# Patient Record
Sex: Female | Born: 1965 | Race: Black or African American | Hispanic: No | Smoking: Never smoker
Health system: Southern US, Community
[De-identification: ages and names within clinical notes are randomized; demographics above are authoritative.]

## PROBLEM LIST (undated history)

## (undated) DIAGNOSIS — Z8719 Personal history of other diseases of the digestive system: Secondary | ICD-10-CM

## (undated) DIAGNOSIS — M1990 Unspecified osteoarthritis, unspecified site: Secondary | ICD-10-CM

## (undated) DIAGNOSIS — I10 Essential (primary) hypertension: Secondary | ICD-10-CM

## (undated) DIAGNOSIS — T4145XA Adverse effect of unspecified anesthetic, initial encounter: Secondary | ICD-10-CM

## (undated) DIAGNOSIS — T8859XA Other complications of anesthesia, initial encounter: Secondary | ICD-10-CM

## (undated) DIAGNOSIS — E785 Hyperlipidemia, unspecified: Secondary | ICD-10-CM

## (undated) HISTORY — DX: Hyperlipidemia, unspecified: E78.5

## (undated) HISTORY — PX: APPENDECTOMY: SHX54

## (undated) HISTORY — PX: TUBAL LIGATION: SHX77

## (undated) HISTORY — DX: Unspecified osteoarthritis, unspecified site: M19.90

## (undated) HISTORY — DX: Essential (primary) hypertension: I10

---

## 1996-10-09 HISTORY — PX: DILATION AND CURETTAGE OF UTERUS: SHX78

## 1996-10-09 HISTORY — PX: HYSTEROSCOPY: SHX211

## 2004-02-22 ENCOUNTER — Encounter: Admission: RE | Admit: 2004-02-22 | Discharge: 2004-02-22 | Payer: Self-pay | Admitting: Gastroenterology

## 2004-10-09 HISTORY — PX: ENDOMETRIAL ABLATION: SHX621

## 2011-01-26 ENCOUNTER — Emergency Department (HOSPITAL_COMMUNITY)
Admission: EM | Admit: 2011-01-26 | Discharge: 2011-01-27 | Disposition: A | Discharge status: Home / Self Care | Payer: 59 | Attending: Emergency Medicine | Admitting: Emergency Medicine

## 2011-01-26 ENCOUNTER — Emergency Department (HOSPITAL_COMMUNITY): Payer: 59

## 2011-01-26 DIAGNOSIS — I10 Essential (primary) hypertension: Secondary | ICD-10-CM | POA: Insufficient documentation

## 2011-01-26 DIAGNOSIS — R0609 Other forms of dyspnea: Secondary | ICD-10-CM | POA: Insufficient documentation

## 2011-01-26 DIAGNOSIS — R0602 Shortness of breath: Secondary | ICD-10-CM | POA: Insufficient documentation

## 2011-01-26 DIAGNOSIS — R059 Cough, unspecified: Secondary | ICD-10-CM | POA: Insufficient documentation

## 2011-01-26 DIAGNOSIS — R079 Chest pain, unspecified: Secondary | ICD-10-CM | POA: Insufficient documentation

## 2011-01-26 DIAGNOSIS — R05 Cough: Secondary | ICD-10-CM | POA: Insufficient documentation

## 2011-01-26 DIAGNOSIS — E789 Disorder of lipoprotein metabolism, unspecified: Secondary | ICD-10-CM | POA: Insufficient documentation

## 2011-01-26 DIAGNOSIS — Z79899 Other long term (current) drug therapy: Secondary | ICD-10-CM | POA: Insufficient documentation

## 2011-01-26 DIAGNOSIS — M069 Rheumatoid arthritis, unspecified: Secondary | ICD-10-CM | POA: Insufficient documentation

## 2011-01-26 DIAGNOSIS — R0989 Other specified symptoms and signs involving the circulatory and respiratory systems: Secondary | ICD-10-CM | POA: Insufficient documentation

## 2011-01-26 LAB — BASIC METABOLIC PANEL
GFR calc non Af Amer: >60 mL/min (ref >60)
Potassium: 4.2 mEq/L (ref 3.5–5.1)
Sodium: 141 mEq/L (ref 135–145)

## 2011-01-26 LAB — DIFFERENTIAL
Basophils Absolute: 0 10*3/uL (ref 0.0–0.1)
Eosinophils Relative: 2 % (ref 0–5)
Lymphocytes Relative: 45 % (ref 12–46)
Lymphs Abs: 3.2 10*3/uL (ref 0.7–4.0)
Monocytes Absolute: 0.4 10*3/uL (ref 0.1–1.0)
Monocytes Relative: 5 % (ref 3–12)
Neutro Abs: 3.3 10*3/uL (ref 1.7–7.7)
Neutrophils Relative %: 47 % (ref 43–77)

## 2011-01-26 LAB — POCT CARDIAC MARKERS
CKMB, poc: 1 ng/mL (ref 1.0–8.0)
Troponin i, poc: <0.05 ng/mL (ref 0.00–0.09)

## 2011-01-26 LAB — CBC
Hemoglobin: 12.6 g/dL (ref 12.0–15.0)
MCHC: 33 g/dL (ref 30.0–36.0)
Platelets: 285 10*3/uL (ref 150–400)

## 2011-01-26 LAB — D-DIMER, QUANTITATIVE: D-Dimer, Quant: 0.58 ug/mL-FEU — ABNORMAL HIGH (ref 0.00–0.48)

## 2011-01-26 LAB — BRAIN NATRIURETIC PEPTIDE: Pro B Natriuretic peptide (BNP): <30 pg/mL (ref 0.0–100.0)

## 2011-01-27 MED ORDER — IOHEXOL 300 MG/ML  SOLN
100.0000 mL | Freq: Once | INTRAMUSCULAR | Status: AC | PRN
  Administered 2011-01-27: 100 mL via INTRAVENOUS

## 2011-08-30 ENCOUNTER — Telehealth (INDEPENDENT_AMBULATORY_CARE_PROVIDER_SITE_OTHER): Payer: Self-pay

## 2011-08-30 NOTE — Telephone Encounter (Signed)
08/29/11 Jacqueline Morrison @ [facility] left a voicemail referring patient to the bariatric surgery program. She asked that an appointment be scheduled for the patient. I scheduled the patient for the bariatric surgery seminar at [facility] for 09/05/11. Mailed information to patient and faxed information to [facility] @  Walk-in Medical Care/Family Medicine/Occupational Health: Barrington Ellison. [address], [place] [address] Phone: [phone]   Fax: [phone]

## 2011-09-29 ENCOUNTER — Ambulatory Visit (INDEPENDENT_AMBULATORY_CARE_PROVIDER_SITE_OTHER): Payer: 59 | Admitting: Surgery

## 2011-09-29 ENCOUNTER — Encounter (INDEPENDENT_AMBULATORY_CARE_PROVIDER_SITE_OTHER): Payer: Self-pay | Admitting: Surgery

## 2011-09-29 ENCOUNTER — Other Ambulatory Visit (INDEPENDENT_AMBULATORY_CARE_PROVIDER_SITE_OTHER): Payer: Self-pay | Admitting: General Surgery

## 2011-09-29 VITALS — BP 136/84 | HR 68 | Temp 97.4°F | Resp 20 | Ht 69.0 in | Wt 399.0 lb

## 2011-09-29 DIAGNOSIS — E669 Obesity, unspecified: Secondary | ICD-10-CM

## 2011-09-29 NOTE — Patient Instructions (Signed)
Scheduling studies per our office

## 2011-09-29 NOTE — Progress Notes (Signed)
Addended by: Latricia Heft on: 09/29/2011 11:21 AM   Modules accepted: Orders

## 2011-09-29 NOTE — Progress Notes (Signed)
Chief Complaint:  Morbid obesity BMI 60  History of Present Illness:  Jacqueline Morrison is an 45 y.o. female with lifelong obesity who comes in today to discuss lap band. She is been into 2 of our informational sessions and is aware of bands, bypasses on sleeves. She knows several of our patients and that has helped her make a decision on what she wanted to have and when. She really has no questions about the procedure and we did give her the [employer]er[employer] booklet on lapband.  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  She is followed by Dr. Benson [place]. She denies having any GERD or obstructive sleep apnea.    Past Medical History  Diagnosis Date  . Arthritis   . Diabetes mellitus     gestational  . Hyperlipidemia   . Hypertension     Past Surgical History  Procedure Date  . Appendectomy   . Tubal ligation   . Hysteroscopy 1998  . Dilation and curettage of uterus 1998  . Endometrial ablation 2006    Medications Prior to Admission  Medication Sig Dispense Refill  . CRESTOR 40 MG tablet daily.      . ramipril (ALTACE) 10 MG capsule daily.       No current facility-administered medications on file as of 09/29/2011.   No Known Allergies Family History  Problem Relation Age of Onset  . Cancer Maternal Aunt     breast  . Cancer Maternal Grandfather     colon   Social History:   reports that she has never smoked. She does not have any smokeless tobacco history on file. She reports that she drinks alcohol. She reports that she does not use illicit drugs.   REVIEW OF SYSTEMS - PERTINENT POSITIVES ONLY: 15 point review of systems is positive for occasional palpitations, arthritis pains, headache. She also has had a history of gestational diabetes, hypercholesterolemia, and hypertension. She does not smoke does not use drugs  Physical Exam:   Blood pressure 136/84, pulse 68, temperature 97.4 F (36.3 C), temperature source Temporal, resp. rate 20, height 5'  9" (1.753 m), weight 399 lb (180.985 kg). Body mass index is 58.92 kg/(m^2).  Gen:  No acute distress.  Well nourished and well groomed.   Neurological: Alert and oriented to person, place, and time. Coordination normal.  Head: Normocephalic and atraumatic.  Eyes: Conjunctivae are normal. Pupils are equal, round, and reactive to light. No scleral icterus.  Neck: Normal range of motion. Neck supple. No tracheal deviation or thyromegaly present.  Cardiovascular:  SR without murmurs or gallops Respiratory: Effort normal.  No respiratory distress. No chest wall tenderness. Breath sounds normal.  No wheezes, rales or rhonchi.  GI:  The abdomen is soft and nontender.  There is no rebound and no guarding. GU: Musculoskeletal: Normal range of motion. Extremities are nontender.  Lymphadenopathy: No cervical, preauricular, postauricular or axillary adenopathy is present Skin: Skin is warm and dry. No rash noted. No diaphoresis. No erythema. No pallor. No clubbing, cyanosis, or edema.  Pscyh: Normal mood and affect. Behavior is normal. Judgment and thought content normal.   LABORATORY RESULTS: No results found for this or any previous visit (from the past 48 hour(s)).  RADIOLOGY RESULTS: No results found.  Problem List: Active Problems:  * No active hospital problems. *    Assessment & Plan: BMI 60 and appropriate candidate for laparoscopic adjustable gastric banding. Will proceed with workup.    Matt  Hortencia Conradi, MD, [facility] [phone] beeper [phone]  09/29/2011 11:05 AM

## 2011-10-16 ENCOUNTER — Other Ambulatory Visit (INDEPENDENT_AMBULATORY_CARE_PROVIDER_SITE_OTHER): Payer: Self-pay | Admitting: Surgery

## 2011-10-16 ENCOUNTER — Ambulatory Visit (HOSPITAL_COMMUNITY)
Admission: RE | Admit: 2011-10-16 | Discharge: 2011-10-16 | Disposition: A | Discharge status: Home / Self Care | Payer: 59 | Source: Ambulatory Visit | Attending: Surgery | Admitting: Surgery

## 2011-10-16 ENCOUNTER — Other Ambulatory Visit: Payer: Self-pay

## 2011-10-16 DIAGNOSIS — I10 Essential (primary) hypertension: Secondary | ICD-10-CM | POA: Insufficient documentation

## 2011-10-16 DIAGNOSIS — M129 Arthropathy, unspecified: Secondary | ICD-10-CM | POA: Insufficient documentation

## 2011-10-16 DIAGNOSIS — Z6841 Body Mass Index (BMI) 40.0 and over, adult: Secondary | ICD-10-CM | POA: Insufficient documentation

## 2011-10-16 DIAGNOSIS — E785 Hyperlipidemia, unspecified: Secondary | ICD-10-CM | POA: Insufficient documentation

## 2011-10-17 LAB — CBC
Hemoglobin: 12.9 g/dL (ref 12.0–15.0)
MCHC: 32.2 g/dL (ref 30.0–36.0)
MCV: 94.1 fL (ref 78.0–100.0)
Platelets: 289 10*3/uL (ref 150–400)
RBC: 4.26 MIL/uL (ref 3.87–5.11)

## 2011-10-17 LAB — COMPREHENSIVE METABOLIC PANEL
AST: 12 U/L (ref 0–37)
Albumin: 4 g/dL (ref 3.5–5.2)
BUN: 14 mg/dL (ref 6–23)
Creat: 0.87 mg/dL (ref 0.50–1.10)
Glucose, Bld: 96 mg/dL (ref 70–99)
Total Bilirubin: 0.4 mg/dL (ref 0.3–1.2)
Total Protein: 6.7 g/dL (ref 6.0–8.3)

## 2011-10-25 ENCOUNTER — Ambulatory Visit: Payer: 59 | Admitting: *Deleted

## 2011-10-27 ENCOUNTER — Other Ambulatory Visit (HOSPITAL_COMMUNITY): Payer: 59

## 2011-10-30 ENCOUNTER — Ambulatory Visit (HOSPITAL_COMMUNITY)
Admission: RE | Admit: 2011-10-30 | Discharge: 2011-10-30 | Disposition: A | Discharge status: Home / Self Care | Payer: 59 | Source: Ambulatory Visit | Attending: Surgery | Admitting: Surgery

## 2011-10-30 ENCOUNTER — Other Ambulatory Visit (INDEPENDENT_AMBULATORY_CARE_PROVIDER_SITE_OTHER): Payer: Self-pay | Admitting: Surgery

## 2011-10-30 DIAGNOSIS — Z01818 Encounter for other preprocedural examination: Secondary | ICD-10-CM | POA: Insufficient documentation

## 2011-10-30 DIAGNOSIS — K449 Diaphragmatic hernia without obstruction or gangrene: Secondary | ICD-10-CM | POA: Insufficient documentation

## 2011-11-01 ENCOUNTER — Encounter: Payer: 59 | Attending: Surgery | Admitting: *Deleted

## 2011-11-01 ENCOUNTER — Encounter: Payer: Self-pay | Admitting: *Deleted

## 2011-11-01 DIAGNOSIS — Z01818 Encounter for other preprocedural examination: Secondary | ICD-10-CM | POA: Insufficient documentation

## 2011-11-01 DIAGNOSIS — E669 Obesity, unspecified: Secondary | ICD-10-CM

## 2011-11-01 DIAGNOSIS — Z713 Dietary counseling and surveillance: Secondary | ICD-10-CM | POA: Insufficient documentation

## 2011-11-01 NOTE — Progress Notes (Signed)
  Pre-Op Assessment Visit: Pre-Operative LAGB Surgery  Medical Nutrition Therapy:  Appt start time: 0730 end time:  0830.  Patient was seen on 11/01/2011 for Pre-Operative LAGB Nutrition Assessment. Assessment and letter of approval faxed to [facility] Bariatric Surgery Program coordinator on 11/01/2011.  Approval letter sent to [facility] center and will be available in the chart under the media tab.  Handouts given during visit include:  Pre-Op Goals Handout  Patient to call for Pre-Op and Post-Op Nutrition Education at [facility] when surgery is scheduled.

## 2011-11-01 NOTE — Patient Instructions (Signed)
   Follow Pre-Op Nutrition Goals to prepare for LAGB Surgery.   Call the [facility] at [phone] once you have been given your surgery date to enrolled in the Pre-Op Nutrition Class. You will need to attend this nutrition class 3-4 weeks prior to your surgery. 

## 2012-01-18 ENCOUNTER — Encounter (INDEPENDENT_AMBULATORY_CARE_PROVIDER_SITE_OTHER): Payer: Self-pay

## 2012-03-21 ENCOUNTER — Encounter: Payer: 59 | Attending: Surgery | Admitting: *Deleted

## 2012-03-21 DIAGNOSIS — Z713 Dietary counseling and surveillance: Secondary | ICD-10-CM | POA: Insufficient documentation

## 2012-03-21 DIAGNOSIS — E669 Obesity, unspecified: Secondary | ICD-10-CM

## 2012-03-21 DIAGNOSIS — Z01818 Encounter for other preprocedural examination: Secondary | ICD-10-CM | POA: Insufficient documentation

## 2012-03-21 NOTE — Progress Notes (Signed)
  Bariatric Class:  Appt start time: 0830 end time:  0930.  Pre-Operative Nutrition Class  Patient was seen on 03/21/2012 for Pre-Operative Bariatric Surgery Education at the [facility].  Surgery date: 04/16/12 Surgery type: LAGB  Samples given per MNT protocol: Bariatric Advantage Multivitamin Lot # [id number] Exp: 09/13  Bariatric Advantage Calcium Citrate Lot # [id number] Exp: 09/13  Celebrate Vitamins Multivitamin Complete - Lot # [id number]; Exp: 11/14 Multivitamin - Lot # [id number]; Exp: 07/14  Celebrate Vitamins Iron 30 mg +C Lot # [id number] Exp:  07/14  Corliss Marcus Protein Powder Lot # [id number] Exp: 09/14  The following the learning objective met by the patient during this course:   Identifies Pre-Op Dietary Goals and will begin 2 weeks pre-operatively   Identifies appropriate sources of fluids and proteins   States protein recommendations and appropriate sources pre and post-operatively  Identifies Post-Operative Dietary Goals and will follow for 2 weeks post-operatively  Identifies appropriate multivitamin and calcium sources  Describes the need for physical activity post-operatively and will follow MD recommendations  States when to call healthcare provider regarding medication questions or post-operative complications  Handouts given during class include:  Pre-Op Bariatric Surgery Diet Handout  Protein Shake Handout  Post-Op Bariatric Surgery Nutrition Handout  BELT Program Information Flyer  Support Group Information Flyer  Follow-Up Plan: Patient will follow-up at [facility] 2 weeks post operatively for diet advancement per MD.

## 2012-03-21 NOTE — Patient Instructions (Signed)
Follow:   Pre-Op Diet per MD 2 weeks prior to surgery  Phase 2- Liquids (clear/full) 2 weeks after surgery  Vitamin/Mineral/Calcium guidelines for purchasing bariatric supplements  Exercise guidelines pre and post-op per MD  Follow-up at [facility] in 2 weeks post-op for diet advancement. Contact Cydney Alvarenga as needed with questions/concerns. 

## 2012-03-29 ENCOUNTER — Encounter (HOSPITAL_COMMUNITY): Payer: Self-pay | Admitting: Pharmacy Technician

## 2012-04-04 ENCOUNTER — Encounter (INDEPENDENT_AMBULATORY_CARE_PROVIDER_SITE_OTHER): Payer: Self-pay | Admitting: Surgery

## 2012-04-04 ENCOUNTER — Ambulatory Visit (INDEPENDENT_AMBULATORY_CARE_PROVIDER_SITE_OTHER): Payer: Self-pay | Admitting: Surgery

## 2012-04-04 VITALS — BP 118/68 | HR 84 | Temp 97.6°F | Resp 18 | Ht 68.0 in | Wt 383.4 lb

## 2012-04-04 DIAGNOSIS — E669 Obesity, unspecified: Secondary | ICD-10-CM

## 2012-04-04 NOTE — Progress Notes (Signed)
Chief Complaint:  Morbid obesity with a BMI of 58 for lap band surgery  History of Present Illness:  Jacqueline Morrison is an 46 y.o. female who comes in today for a preop lab and surgery visit. Her surgery is scheduled for July 9. She is followed by Dr. Carolynn Sayers.  Her preoperative upper GI showed a small sliding hiatal hernia but no GE reflux. She denies any symptoms of GE reflux. Her ultrasound suggested a deep hypoechoic probable hemangioma of the left lobe of the liver. I mentioned that to her and ask her carmine me to order followup studies in about 6 months. She is studied him information on the lapband and is ready for the surgery.  Past Medical History  Diagnosis Date  . Arthritis   . Diabetes mellitus     gestational  . Hyperlipidemia   . Hypertension     Past Surgical History  Procedure Date  . Appendectomy   . Tubal ligation   . Hysteroscopy 1998  . Dilation and curettage of uterus 1998  . Endometrial ablation 2006    Current Outpatient Prescriptions  Medication Sig Dispense Refill  . acetaminophen (TYLENOL) 500 MG tablet Take 500 mg by mouth every 6 (six) hours as needed. pain      . CRESTOR 40 MG tablet Take 40 mg by mouth daily.       . Multiple Vitamin (MULTIVITAMIN) capsule Take 1 capsule by mouth daily.        . ramipril (ALTACE) 10 MG capsule Take 10 mg by mouth daily.        Review of patient's allergies indicates no known allergies. Family History  Problem Relation Age of Onset  . Cancer Maternal Aunt     breast  . Cancer Maternal Grandfather     colon   Social History:   reports that she has never smoked. She does not have any smokeless tobacco history on file. She reports that she drinks about 1.2 ounces of alcohol per week. She reports that she does not use illicit drugs.   REVIEW OF SYSTEMS - PERTINENT POSITIVES ONLY: noncontributory  Physical Exam:   Blood pressure 118/68, pulse 84, temperature 97.6 F (36.4 C), temperature source Temporal, resp.  rate 18, height 5\' 8"  (1.727 m), weight 383 lb 6 oz (173.898 kg). Body mass index is 58.29 kg/(m^2).  Gen:  WDWN AAF NAD  Neurological: Alert and oriented to person, place, and time. Motor and sensory function is grossly intact  Head: Normocephalic and atraumatic.  Eyes: Conjunctivae are normal. Pupils are equal, round, and reactive to light. No scleral icterus.  Neck: Normal range of motion. Neck supple. No tracheal deviation or thyromegaly present.  Cardiovascular:  SR without murmurs or gallops.  No carotid bruits Respiratory: Effort normal.  No respiratory distress. No chest wall tenderness. Breath sounds normal.  No wheezes, rales or rhonchi.  Abdomen:  obese GU: Musculoskeletal: Normal range of motion. Extremities are nontender. No cyanosis, edema or clubbing noted Lymphadenopathy: No cervical, preauricular, postauricular or axillary adenopathy is present Skin: Skin is warm and dry. No rash noted. No diaphoresis. No erythema. No pallor. Pscyh: Normal mood and affect. Behavior is normal. Judgment and thought content normal.   LABORATORY RESULTS: No results found for this or any previous visit (from the past 48 hour(s)).  RADIOLOGY RESULTS: No results found.  Problem List: Patient Active Problem List  Diagnosis  . Obesity    Assessment & Plan: Morbid obesity for lap band. Small hiatal hernia  noted on upper GI. Will check with balloon test and patient informed we may repair that. Probable hemangioma of the left liver explained to her. She was asked to remind [place] to followup him in 6 months.    Matt B. Daphine Deutscher, MD, [facility] [phone] beeper [phone]  04/04/2012 12:00 PM

## 2012-04-04 NOTE — Patient Instructions (Addendum)

## 2012-04-05 ENCOUNTER — Encounter (HOSPITAL_COMMUNITY)
Admission: RE | Admit: 2012-04-05 | Discharge: 2012-04-05 | Disposition: A | Discharge status: Home / Self Care | Payer: 59 | Source: Ambulatory Visit | Attending: Surgery | Admitting: Surgery

## 2012-04-05 ENCOUNTER — Encounter (HOSPITAL_COMMUNITY): Payer: Self-pay

## 2012-04-05 HISTORY — DX: Personal history of other diseases of the digestive system: Z87.19

## 2012-04-05 HISTORY — DX: Other complications of anesthesia, initial encounter: T88.59XA

## 2012-04-05 HISTORY — DX: Adverse effect of unspecified anesthetic, initial encounter: T41.45XA

## 2012-04-05 LAB — CBC
MCH: 30 pg (ref 26.0–34.0)
MCHC: 32.9 g/dL (ref 30.0–36.0)
MCV: 91 fL (ref 78.0–100.0)
Platelets: 304 10*3/uL (ref 150–400)
RDW: 14.9 % (ref 11.5–15.5)

## 2012-04-05 LAB — BASIC METABOLIC PANEL
Calcium: 9.7 mg/dL (ref 8.4–10.5)
Creatinine, Ser: 0.74 mg/dL (ref 0.50–1.10)
GFR calc non Af Amer: >90 mL/min (ref >90)
Sodium: 138 mEq/L (ref 135–145)

## 2012-04-05 LAB — HCG, SERUM, QUALITATIVE: Preg, Serum: NEGATIVE

## 2012-04-05 NOTE — Pre-Procedure Instructions (Signed)
PT HAS E[facility] AND [facility]R REPORT [facility]--REPORT IN EPIC AND COPY ON PT'S CHART. CBC, BMET, SERUM PREGANCY TESTS WERE DONE TODAY AT [facility]. PREOP INSTRUCTIONS WERE DISCUSSED WITH PT USING THE TEACH BACK METHOD.

## 2012-04-05 NOTE — Patient Instructions (Signed)
YOUR SURGERY IS SCHEDULED ON:  Tuesday  July 9  AT 11:50 AM  REPORT TO [place] [facility]AY [facility] AT:  9:45 AM      PHONE # FOR SHORT STAY IS [phone]  FLEETS ENEMA AT BEDTIME NIGHT BEFORE YOUR SURGERY.  DO NOT EAT OR DRINK ANYTHING AFTER MIDNIGHT THE NIGHT BEFORE YOUR SURGERY.  YOU MAY BRUSH YOUR TEETH, RINSE OUT YOUR MOUTH--BUT NO WATER, NO FOOD, NO CHEWING GUM, NO MINTS, NO CANDIES, NO CHEWING TOBACCO.  PLEASE TAKE THE FOLLOWING MEDICATIONS THE AM OF YOUR SURGERY WITH A FEW SIPS OF WATER: CRESTOR    IF YOU USE INHALERS--USE YOUR INHALERS THE AM OF YOUR SURGERY AND BRING INHALERS TO THE HOSPITAL -TAKE TO SURGERY.    IF YOU ARE DIABETIC:  DO NOT TAKE ANY DIABETIC MEDICATIONS THE AM OF YOUR SURGERY.  IF YOU TAKE INSULIN IN THE EVENINGS--PLEASE ONLY TAKE 1/2 NORMAL EVENING DOSE THE NIGHT BEFORE YOUR SURGERY.  NO INSULIN THE AM OF YOUR SURGERY.  IF YOU HAVE SLEEP APNEA AND USE CPAP OR BIPAP--PLEASE BRING THE MASK --NOT THE MACHINE-NOT THE TUBING   -JUST THE MASK. DO NOT BRING VALUABLES, MONEY, CREDIT CARDS.  CONTACT LENS, DENTURES / PARTIALS, GLASSES SHOULD NOT BE WORN TO SURGERY AND IN MOST CASES-HEARING AIDS WILL NEED TO BE REMOVED.  BRING YOUR GLASSES CASE, ANY EQUIPMENT NEEDED FOR YOUR CONTACT LENS. FOR PATIENTS ADMITTED TO THE HOSPITAL--CHECK OUT TIME THE DAY OF DISCHARGE IS 11:00 AM.  ALL INPATIENT ROOMS ARE PRIVATE - WITH BATHROOM, TELEPHONE, TELEVISION AND WIFI INTERNET. IF YOU ARE BEING DISCHARGED THE SAME DAY OF YOUR SURGERY--YOU CAN NOT DRIVE YOURSELF HOME--AND SHOULD NOT GO HOME ALONE BY TAXI OR BUS.  NO DRIVING OR OPERATING MACHINERY FOR 24 HOURS FOLLOWING ANESTHESIA / PAIN MEDICATIONS.                            SPECIAL INSTRUCTIONS:  CHLORHEXIDINE SOAP SHOWER (other brand names are Betasept and Hibiclens ) PLEASE SHOWER WITH CHLORHEXIDINE THE NIGHT BEFORE YOUR SURGERY AND THE AM OF YOUR SURGERY. DO NOT USE CHLORHEXIDINE ON YOUR FACE OR PRIVATE AREAS--YOU MAY USE YOUR  NORMAL SOAP THOSE AREAS AND YOUR NORMAL SHAMPOO.  WOMEN SHOULD AVOID SHAVING UNDER ARMS AND SHAVING LEGS 48 HOURS BEFORE USING CHLORHEXIDINE TO AVOID SKIN IRRITATION.  DO NOT USE IF ALLERGIC TO CHLORHEXIDINE.  PLEASE READ OVER ANY  FACT SHEETS THAT YOU WERE GIVEN: MRSA INFORMATION

## 2012-04-08 NOTE — Pre-Procedure Instructions (Signed)
Pt's weight is 383lb --bari bed requested with christine in portable equipment for day of surgery - 7/9  And order is in epic

## 2012-04-16 ENCOUNTER — Inpatient Hospital Stay (HOSPITAL_COMMUNITY)
Admission: RE | Admit: 2012-04-16 | Discharge: 2012-04-17 | DRG: 621 | Disposition: A | Discharge status: Home / Self Care | Payer: 59 | Source: Ambulatory Visit | Attending: Surgery | Admitting: Surgery

## 2012-04-16 ENCOUNTER — Encounter (HOSPITAL_COMMUNITY): Payer: Self-pay | Admitting: *Deleted

## 2012-04-16 ENCOUNTER — Ambulatory Visit (HOSPITAL_COMMUNITY): Payer: 59 | Admitting: Anesthesiology

## 2012-04-16 ENCOUNTER — Encounter (HOSPITAL_COMMUNITY): Payer: Self-pay | Admitting: Anesthesiology

## 2012-04-16 ENCOUNTER — Encounter (HOSPITAL_COMMUNITY)
Admission: RE | Disposition: A | Discharge status: Home / Self Care | Payer: Self-pay | Source: Ambulatory Visit | Attending: Surgery

## 2012-04-16 DIAGNOSIS — E669 Obesity, unspecified: Secondary | ICD-10-CM

## 2012-04-16 DIAGNOSIS — Z6841 Body Mass Index (BMI) 40.0 and over, adult: Secondary | ICD-10-CM

## 2012-04-16 DIAGNOSIS — Z79899 Other long term (current) drug therapy: Secondary | ICD-10-CM

## 2012-04-16 DIAGNOSIS — I10 Essential (primary) hypertension: Secondary | ICD-10-CM

## 2012-04-16 DIAGNOSIS — E785 Hyperlipidemia, unspecified: Secondary | ICD-10-CM | POA: Diagnosis present

## 2012-04-16 DIAGNOSIS — K219 Gastro-esophageal reflux disease without esophagitis: Secondary | ICD-10-CM | POA: Diagnosis present

## 2012-04-16 DIAGNOSIS — E119 Type 2 diabetes mellitus without complications: Secondary | ICD-10-CM

## 2012-04-16 DIAGNOSIS — K449 Diaphragmatic hernia without obstruction or gangrene: Secondary | ICD-10-CM | POA: Diagnosis present

## 2012-04-16 DIAGNOSIS — Z9884 Bariatric surgery status: Secondary | ICD-10-CM

## 2012-04-16 DIAGNOSIS — Z01812 Encounter for preprocedural laboratory examination: Secondary | ICD-10-CM

## 2012-04-16 HISTORY — PX: LAPAROSCOPIC GASTRIC BANDING: SHX1100

## 2012-04-16 HISTORY — PX: HIATAL HERNIA REPAIR: SHX195

## 2012-04-16 LAB — CBC
Hemoglobin: 13.3 g/dL (ref 12.0–15.0)
MCHC: 33.4 g/dL (ref 30.0–36.0)
RBC: 4.43 MIL/uL (ref 3.87–5.11)

## 2012-04-16 LAB — CREATININE, SERUM
Creatinine, Ser: 0.66 mg/dL (ref 0.50–1.10)
GFR calc non Af Amer: >90 mL/min (ref >90)

## 2012-04-16 SURGERY — GASTRIC BANDING, LAPAROSCOPIC
Anesthesia: General | Site: Abdomen | Wound class: Clean

## 2012-04-16 MED ORDER — ONDANSETRON HCL 4 MG/2ML IJ SOLN: 4.0000 mg | INTRAMUSCULAR | Status: DC | PRN

## 2012-04-16 MED ORDER — ACETAMINOPHEN 10 MG/ML IV SOLN
INTRAVENOUS | Status: DC | PRN
  Administered 2012-04-16: 1000 mg via INTRAVENOUS

## 2012-04-16 MED ORDER — HYDROMORPHONE HCL PF 1 MG/ML IJ SOLN
0.2500 mg | INTRAMUSCULAR | Status: DC | PRN
  Administered 2012-04-16 (×2): 0.5 mg via INTRAVENOUS

## 2012-04-16 MED ORDER — CEFOXITIN SODIUM-DEXTROSE 1-4 GM-% IV SOLR (PREMIX)
INTRAVENOUS | Status: AC
  Filled 2012-04-16: qty 100

## 2012-04-16 MED ORDER — HEPARIN SODIUM (PORCINE) 5000 UNIT/ML IJ SOLN
5000.0000 [IU] | Freq: Three times a day (TID) | INTRAMUSCULAR | Status: DC
  Administered 2012-04-16 – 2012-04-17 (×3): 5000 [IU] via SUBCUTANEOUS
  Filled 2012-04-16 (×5): qty 1

## 2012-04-16 MED ORDER — NEOSTIGMINE METHYLSULFATE 1 MG/ML IJ SOLN
INTRAMUSCULAR | Status: DC | PRN
  Administered 2012-04-16: 5 mg via INTRAVENOUS

## 2012-04-16 MED ORDER — OXYCODONE-ACETAMINOPHEN 5-325 MG/5ML PO SOLN
5.0000 mL | ORAL | Status: DC | PRN
  Administered 2012-04-17: 10 mL via ORAL
  Filled 2012-04-16: qty 10

## 2012-04-16 MED ORDER — UNJURY CHICKEN SOUP POWDER: 2.0000 [oz_av] | Freq: Four times a day (QID) | ORAL | Status: DC

## 2012-04-16 MED ORDER — HEPARIN SODIUM (PORCINE) 5000 UNIT/ML IJ SOLN
INTRAMUSCULAR | Status: AC
  Filled 2012-04-16: qty 1

## 2012-04-16 MED ORDER — SODIUM CHLORIDE 0.9 % IJ SOLN
INTRAMUSCULAR | Status: DC | PRN
  Administered 2012-04-16: 10 mL via INTRAVENOUS

## 2012-04-16 MED ORDER — HYDROMORPHONE HCL PF 1 MG/ML IJ SOLN
INTRAMUSCULAR | Status: AC
  Filled 2012-04-16: qty 1

## 2012-04-16 MED ORDER — DEXAMETHASONE SODIUM PHOSPHATE 10 MG/ML IJ SOLN
INTRAMUSCULAR | Status: DC | PRN
  Administered 2012-04-16: 10 mg via INTRAVENOUS

## 2012-04-16 MED ORDER — UNJURY VANILLA POWDER
2.0000 [oz_av] | Freq: Four times a day (QID) | ORAL | Status: DC
  Administered 2012-04-17 (×2): 2 [oz_av] via ORAL

## 2012-04-16 MED ORDER — MORPHINE SULFATE 2 MG/ML IJ SOLN
2.0000 mg | INTRAMUSCULAR | Status: DC | PRN
  Administered 2012-04-16 – 2012-04-17 (×5): 2 mg via INTRAVENOUS
  Filled 2012-04-16 (×2): qty 1
  Filled 2012-04-16: qty 2
  Filled 2012-04-16 (×2): qty 1

## 2012-04-16 MED ORDER — PROPOFOL 10 MG/ML IV BOLUS: INTRAVENOUS | Status: DC | PRN

## 2012-04-16 MED ORDER — PROPOFOL 10 MG/ML IV EMUL: INTRAVENOUS | Status: DC | PRN

## 2012-04-16 MED ORDER — ACETAMINOPHEN 10 MG/ML IV SOLN
INTRAVENOUS | Status: AC
  Filled 2012-04-16: qty 100

## 2012-04-16 MED ORDER — RAMIPRIL 10 MG PO CAPS
10.0000 mg | ORAL_CAPSULE | Freq: Every day | ORAL | Status: DC
  Administered 2012-04-17: 10 mg via ORAL
  Filled 2012-04-16 (×2): qty 1

## 2012-04-16 MED ORDER — HEPARIN SODIUM (PORCINE) 5000 UNIT/ML IJ SOLN
5000.0000 [IU] | Freq: Once | INTRAMUSCULAR | Status: AC
  Administered 2012-04-16: 5000 [IU] via SUBCUTANEOUS

## 2012-04-16 MED ORDER — ACETAMINOPHEN 160 MG/5ML PO SOLN
650.0000 mg | ORAL | Status: DC | PRN
Start: 2012-04-16 — End: 2012-04-17

## 2012-04-16 MED ORDER — FENTANYL CITRATE 0.05 MG/ML IJ SOLN
INTRAMUSCULAR | Status: DC | PRN
  Administered 2012-04-16: 100 ug via INTRAVENOUS
  Administered 2012-04-16: 50 ug via INTRAVENOUS
  Administered 2012-04-16: 100 ug via INTRAVENOUS

## 2012-04-16 MED ORDER — BUPIVACAINE LIPOSOME 1.3 % IJ SUSP
20.0000 mL | Freq: Once | INTRAMUSCULAR | Status: DC
  Filled 2012-04-16: qty 20

## 2012-04-16 MED ORDER — ONDANSETRON HCL 4 MG/2ML IJ SOLN
INTRAMUSCULAR | Status: DC | PRN
  Administered 2012-04-16: 4 mg via INTRAVENOUS

## 2012-04-16 MED ORDER — BUPIVACAINE LIPOSOME 1.3 % IJ SUSP
INTRAMUSCULAR | Status: DC | PRN
  Administered 2012-04-16: 20 mL

## 2012-04-16 MED ORDER — SUCCINYLCHOLINE CHLORIDE 20 MG/ML IJ SOLN
INTRAMUSCULAR | Status: DC | PRN
  Administered 2012-04-16: 175 mg via INTRAVENOUS

## 2012-04-16 MED ORDER — UNJURY CHOCOLATE CLASSIC POWDER: 2.0000 [oz_av] | Freq: Four times a day (QID) | ORAL | Status: DC

## 2012-04-16 MED ORDER — PROPOFOL 10 MG/ML IV EMUL
INTRAVENOUS | Status: DC | PRN
  Administered 2012-04-16: 250 mg via INTRAVENOUS

## 2012-04-16 MED ORDER — GLYCOPYRROLATE 0.2 MG/ML IJ SOLN
INTRAMUSCULAR | Status: DC | PRN
  Administered 2012-04-16: .8 mg via INTRAVENOUS

## 2012-04-16 MED ORDER — HYDROMORPHONE HCL PF 1 MG/ML IJ SOLN
INTRAMUSCULAR | Status: DC | PRN
  Administered 2012-04-16: 0.5 mg via INTRAVENOUS

## 2012-04-16 MED ORDER — LACTATED RINGERS IV SOLN
INTRAVENOUS | Status: DC
  Administered 2012-04-16: 1000 mL via INTRAVENOUS
  Administered 2012-04-16: 12:00:00 via INTRAVENOUS

## 2012-04-16 MED ORDER — DEXTROSE 5 % IV SOLN
2.0000 g | INTRAVENOUS | Status: AC
  Administered 2012-04-16: 2 g via INTRAVENOUS
  Filled 2012-04-16: qty 2

## 2012-04-16 MED ORDER — LIDOCAINE HCL (CARDIAC) 20 MG/ML IV SOLN
INTRAVENOUS | Status: DC | PRN
  Administered 2012-04-16: 100 mg via INTRAVENOUS

## 2012-04-16 MED ORDER — ROCURONIUM BROMIDE 100 MG/10ML IV SOLN
INTRAVENOUS | Status: DC | PRN
  Administered 2012-04-16: 50 mg via INTRAVENOUS

## 2012-04-16 SURGICAL SUPPLY — 64 items
BAND LAP 10.0 W/TUBES (Band) ×3 IMPLANT
BENZOIN TINCTURE PRP APPL 2/3 (GAUZE/BANDAGES/DRESSINGS) ×3 IMPLANT
BLADE HEX COATED 2.75 (ELECTRODE) ×3 IMPLANT
BLADE SURG 15 STRL LF DISP TIS (BLADE) ×2 IMPLANT
BLADE SURG 15 STRL SS (BLADE) ×1
CANISTER SUCTION 2500CC (MISCELLANEOUS) ×3 IMPLANT
CLOTH BEACON ORANGE TIMEOUT ST (SAFETY) ×3 IMPLANT
CLSR STERI-STRIP ANTIMIC 1/2X4 (GAUZE/BANDAGES/DRESSINGS) ×3 IMPLANT
COVER SURGICAL LIGHT HANDLE (MISCELLANEOUS) ×3 IMPLANT
DECANTER SPIKE VIAL GLASS SM (MISCELLANEOUS) ×6 IMPLANT
DEVICE SUT QUICK LOAD TK 5 (STAPLE) ×15 IMPLANT
DEVICE SUT TI-KNOT TK 5X26 (MISCELLANEOUS) ×3 IMPLANT
DEVICE SUTURE ENDOST 10MM (ENDOMECHANICALS) ×3 IMPLANT
DISSECTOR BLUNT TIP ENDO 5MM (MISCELLANEOUS) IMPLANT
DRAPE CAMERA CLOSED 9X96 (DRAPES) ×3 IMPLANT
ELECT REM PT RETURN 9FT ADLT (ELECTROSURGICAL) ×3
ELECTRODE REM PT RTRN 9FT ADLT (ELECTROSURGICAL) ×2 IMPLANT
GAUZE SPONGE 2X2 8PLY STRL LF (GAUZE/BANDAGES/DRESSINGS) ×2 IMPLANT
GLOVE BIOGEL M 8.0 STRL (GLOVE) ×3 IMPLANT
GLOVE BIOGEL PI IND STRL 7.0 (GLOVE) ×2 IMPLANT
GLOVE BIOGEL PI INDICATOR 7.0 (GLOVE) ×1
GOWN STRL NON-REIN LRG LVL3 (GOWN DISPOSABLE) ×3 IMPLANT
GOWN STRL REIN XL XLG (GOWN DISPOSABLE) ×6 IMPLANT
HOVERMATT SINGLE USE (MISCELLANEOUS) ×3 IMPLANT
KIT BASIN OR (CUSTOM PROCEDURE TRAY) ×3 IMPLANT
MESH HERNIA 1X4 RECT BARD (Mesh General) ×2 IMPLANT
MESH HERNIA BARD 1X4 (Mesh General) ×1 IMPLANT
NEEDLE SPNL 22GX3.5 QUINCKE BK (NEEDLE) ×3 IMPLANT
NS IRRIG 1000ML POUR BTL (IV SOLUTION) ×3 IMPLANT
PACK UNIVERSAL I (CUSTOM PROCEDURE TRAY) ×3 IMPLANT
PENCIL BUTTON HOLSTER BLD 10FT (ELECTRODE) ×3 IMPLANT
SCALPEL HARMONIC ACE (MISCELLANEOUS) IMPLANT
SET IRRIG TUBING LAPAROSCOPIC (IRRIGATION / IRRIGATOR) IMPLANT
SHEARS CURVED HARMONIC AC 45CM (MISCELLANEOUS) IMPLANT
SLEEVE ADV FIXATION 5X100MM (TROCAR) IMPLANT
SLEEVE Z-THREAD 5X100MM (TROCAR) IMPLANT
SOLUTION ANTI FOG 6CC (MISCELLANEOUS) ×3 IMPLANT
SPONGE GAUZE 2X2 STER 10/PKG (GAUZE/BANDAGES/DRESSINGS) ×1
SPONGE GAUZE 4X4 12PLY (GAUZE/BANDAGES/DRESSINGS) ×3 IMPLANT
SPONGE LAP 18X18 X RAY DECT (DISPOSABLE) ×3 IMPLANT
STAPLER VISISTAT 35W (STAPLE) ×3 IMPLANT
STRIP CLOSURE SKIN 1/2X4 (GAUZE/BANDAGES/DRESSINGS) IMPLANT
SUT ETHIBOND 2 0 SH (SUTURE) ×3
SUT ETHIBOND 2 0 SH 36X2 (SUTURE) ×6 IMPLANT
SUT PROLENE 2 0 CT2 30 (SUTURE) ×3 IMPLANT
SUT SILK 0 (SUTURE)
SUT SILK 0 30XBRD TIE 6 (SUTURE) IMPLANT
SUT SURGIDAC NAB ES-9 0 48 120 (SUTURE) ×6 IMPLANT
SUT VIC AB 2-0 SH 27 (SUTURE)
SUT VIC AB 2-0 SH 27X BRD (SUTURE) IMPLANT
SUT VIC AB 4-0 SH 18 (SUTURE) ×3 IMPLANT
SYR 20CC LL (SYRINGE) ×3 IMPLANT
SYR 30ML LL (SYRINGE) ×3 IMPLANT
SYS KII OPTICAL ACCESS 15MM (TROCAR) ×3
SYSTEM KII OPTICAL ACCESS 15MM (TROCAR) ×2 IMPLANT
TAPE CLOTH SURG 4X10 WHT LF (GAUZE/BANDAGES/DRESSINGS) ×3 IMPLANT
TOWEL OR 17X26 10 PK STRL BLUE (TOWEL DISPOSABLE) ×6 IMPLANT
TROCAR ADV FIXATION 11X100MM (TROCAR) IMPLANT
TROCAR XCEL NON-BLD 11X100MML (ENDOMECHANICALS) ×3 IMPLANT
TROCAR Z-THREAD FIOS 11X100 BL (TROCAR) IMPLANT
TROCAR Z-THREAD FIOS 5X100MM (TROCAR) ×3 IMPLANT
TROCAR Z-THREAD SLEEVE 11X100 (TROCAR) IMPLANT
TUBE CALIBRATION LAPBAND (TUBING) ×3 IMPLANT
TUBING INSUFFLATION 10FT LAP (TUBING) ×3 IMPLANT

## 2012-04-16 NOTE — H&P (Addendum)
Chief Complaint: Morbid obesity with a BMI of 58 for lap band surgery  History of Present Illness: Jacqueline Morrison is an 46 y.o. female who comes in today for a preop lab and surgery visit. Her surgery is scheduled for July 9. She is followed by Dr. Carolynn Sayers. Her preoperative upper GI showed a small sliding hiatal hernia but no GE reflux. She denies any symptoms of GE reflux. Her ultrasound suggested a deep hypoechoic probable hemangioma of the left lobe of the liver. I mentioned that to her and ask her carmine me to order followup studies in about 6 months. She is studied him information on the lapband and is ready for the surgery.  Past Medical History   Diagnosis  Date   .  Arthritis    .  Diabetes mellitus      gestational   .  Hyperlipidemia    .  Hypertension     Past Surgical History   Procedure  Date   .  Appendectomy    .  Tubal ligation    .  Hysteroscopy  1998   .  Dilation and curettage of uterus  1998   .  Endometrial ablation  2006    Current Outpatient Prescriptions   Medication  Sig  Dispense  Refill   .  acetaminophen (TYLENOL) 500 MG tablet  Take 500 mg by mouth every 6 (six) hours as needed. pain     .  CRESTOR 40 MG tablet  Take 40 mg by mouth daily.     .  Multiple Vitamin (MULTIVITAMIN) capsule  Take 1 capsule by mouth daily.     .  ramipril (ALTACE) 10 MG capsule  Take 10 mg by mouth daily.     Review of patient's allergies indicates no known allergies.  Family History   Problem  Relation  Age of Onset   .  Cancer  Maternal Aunt       breast    .  Cancer  Maternal Grandfather       colon   Social History: reports that she has never smoked. She does not have any smokeless tobacco history on file. She reports that she drinks about 1.2 ounces of alcohol per week. She reports that she does not use illicit drugs.  REVIEW OF SYSTEMS - PERTINENT POSITIVES ONLY:  noncontributory  Physical Exam:  Blood pressure 118/68, pulse 84, temperature 97.6 F (36.4 C),  temperature source Temporal, resp. rate 18, height 5\' 8"  (1.727 m), weight 383 lb 6 oz (173.898 kg).  Body mass index is 58.29 kg/(m^2).  Gen: WDWN AAF NAD  Neurological: Alert and oriented to person, place, and time. Motor and sensory function is grossly intact  Head: Normocephalic and atraumatic.  Eyes: Conjunctivae are normal. Pupils are equal, round, and reactive to light. No scleral icterus.  Neck: Normal range of motion. Neck supple. No tracheal deviation or thyromegaly present.  Cardiovascular: SR without murmurs or gallops. No carotid bruits  Respiratory: Effort normal. No respiratory distress. No chest wall tenderness. Breath sounds normal. No wheezes, rales or rhonchi.  Abdomen: obese  GU:  Musculoskeletal: Normal range of motion. Extremities are nontender. No cyanosis, edema or clubbing noted Lymphadenopathy: No cervical, preauricular, postauricular or axillary adenopathy is present Skin: Skin is warm and dry. No rash noted. No diaphoresis. No erythema. No pallor. Pscyh: Normal mood and affect. Behavior is normal. Judgment and thought content normal.  LABORATORY RESULTS:  No results found for this or any previous visit (  from the past 48 hour(s)).  RADIOLOGY RESULTS:  No results found.  Problem List:  Patient Active Problem List   Diagnosis   .  Obesity   Assessment & Plan:  Morbid obesity for lap band. Small hiatal hernia noted on upper GI. Will check with balloon test and patient informed we may repair that. Probable hemangioma of the left liver explained to her. She was asked to remind [place] to followup him in 6 months.  Matt B. Daphine Deutscher, MD, [facility]  [phone] beeper  [phone]  There has been no change in the patient's past medical history or physical exam in the past 24 hours to the best of my knowledge. I examined the patient in the holding area and have made any changes to the history and physical exam report that is included above.    Expectations and outcome results have been discussed with the patient to include risks and benefits.  All questions have been answered and we will proceed with previously discussed procedure noted and signed in the consent form in the patient's record.    Jaecion Dempster BMD FACS 10:16 AM  04/16/2012

## 2012-04-16 NOTE — Anesthesia Postprocedure Evaluation (Signed)
  Anesthesia Post-op Note  Patient: Jacqueline Morrison  Procedure(s) Performed: Procedure(s) (LRB): LAPAROSCOPIC GASTRIC BANDING (N/A) LAPAROSCOPIC REPAIR OF HIATAL HERNIA ()  Patient Location: PACU  Anesthesia Type: General  Level of Consciousness: oriented and sedated  Airway and Oxygen Therapy: Patient Spontanous Breathing and Patient connected to nasal cannula oxygen  Post-op Pain: mild  Post-op Assessment: Post-op Vital signs reviewed, Patient's Cardiovascular Status Stable, Respiratory Function Stable and Patent Airway  Post-op Vital Signs: stable  Complications: No apparent anesthesia complications

## 2012-04-16 NOTE — Transfer of Care (Signed)
Immediate Anesthesia Transfer of Care Note  Patient: Jacqueline Morrison  Procedure(s) Performed: Procedure(s) (LRB): LAPAROSCOPIC GASTRIC BANDING (N/A) LAPAROSCOPIC REPAIR OF HIATAL HERNIA ()  Patient Location: PACU  Anesthesia Type: General  Level of Consciousness: awake and patient cooperative  Airway & Oxygen Therapy: Patient Spontanous Breathing and Patient connected to face mask oxygen  Post-op Assessment: Report given to PACU RN, Post -op Vital signs reviewed and stable and Patient moving all extremities  Post vital signs: stable  Complications: No apparent anesthesia complications

## 2012-04-16 NOTE — Anesthesia Preprocedure Evaluation (Signed)
Anesthesia Evaluation  Patient identified by MRN, date of birth, ID band Patient awake    Reviewed: Allergy & Precautions, H&P , NPO status , Patient's Chart, lab work & pertinent test results, reviewed documented beta blocker date and time   Airway Mallampati: II TM Distance: >3 FB Neck ROM: Full    Dental  (+) Teeth Intact and Dental Advisory Given   Pulmonary neg pulmonary ROS,  breath sounds clear to auscultation        Cardiovascular hypertension, Pt. on medications Rhythm:Regular Rate:Normal  Denies cardiac symptoms   Neuro/Psych negative neurological ROS  negative psych ROS   GI/Hepatic negative GI ROS, Neg liver ROS,   Endo/Other  negative endocrine ROSMorbid obesityDenies hx of DM  Renal/GU negative Renal ROS  negative genitourinary   Musculoskeletal negative musculoskeletal ROS (+)   Abdominal   Peds negative pediatric ROS (+)  Hematology negative hematology ROS (+)   Anesthesia Other Findings   Reproductive/Obstetrics negative OB ROS                           Anesthesia Physical Anesthesia Plan  ASA: III  Anesthesia Plan: General   Post-op Pain Management:    Induction: Intravenous and Cricoid pressure planned  Airway Management Planned: Oral ETT  Additional Equipment:   Intra-op Plan:   Post-operative Plan: Extubation in OR  Informed Consent: I have reviewed the patients History and Physical, chart, labs and discussed the procedure including the risks, benefits and alternatives for the proposed anesthesia with the patient or authorized representative who has indicated his/her understanding and acceptance.   Dental advisory given  Plan Discussed with: CRNA and Surgeon  Anesthesia Plan Comments:         Anesthesia Quick Evaluation

## 2012-04-16 NOTE — Progress Notes (Signed)
Enema last night as bowel prep per Dr. Daphine Deutscher

## 2012-04-16 NOTE — Op Note (Signed)
@  DATE@ Surgeon: Wenda Low, MD, FACS Asst:  Ovidio Kin, MD, FACS  Procedure: Laparoscopic adjustable gastric banding with APS and 2 suture hiatus hernia repair  Anes:  General  EBL:  Minimal  Description of Procedure  The patient was taken to OR # 1 and given general anesthesia.  After a prep with PCMX the patient was draped and a timeout performed.  Access to the abdomen was achieved with a 0 degree Optiview technique through the left upper quadrant.    Adhesions were minimal.  Ports were placed to the the right of the midline including a 15 trocar in  the right upper quadrant placed obliquely.  The Satira Mccallum was used to retract the left lateral segment and the peritoneum was incised along the left crus.  A hiatus hernia was noticeable and correlated with UGI findings.  This was dissected posteriorally and closed with 2 sutures of Surgidec affixed with Ty Knots.  The calibration tube was used in this and subsequently.   The pars flaccida technique was utilized to insert the blunt "finger " dissector from right to left behind the stomach.    The lapband APs  Had been previously flushed and was inserted through the 15 trocar.  It was placed in the blunt dissector tip and pulled around behind the stomach.   The EJ junction as assessed for a hiatus hernia and was repaired as mentioned above.  The band was plicated with 3 sutures placed free hand and secured with Ty Knots.  An antislip stitch was placed along the lessor curvature and free tied. .  The tubing was brought out through the lower incision on the right and connected to the port which had mesh sewn onto the back and was placed into the subcutaneous pocket.  The incisions were closed with 4-0 vicryl and steristrips.  Incisions were injected with 40 cc of diluted Exparel The patient was taken to the PACU in stable condition.    Matt B. Daphine Deutscher, MD, [facility], [place] [phone]

## 2012-04-17 ENCOUNTER — Inpatient Hospital Stay (HOSPITAL_COMMUNITY): Payer: 59

## 2012-04-17 ENCOUNTER — Encounter (HOSPITAL_COMMUNITY): Payer: Self-pay | Admitting: Surgery

## 2012-04-17 DIAGNOSIS — Z9884 Bariatric surgery status: Secondary | ICD-10-CM

## 2012-04-17 LAB — DIFFERENTIAL
Basophils Absolute: 0 10*3/uL (ref 0.0–0.1)
Basophils Relative: 0 % (ref 0–1)
Eosinophils Absolute: 0 10*3/uL (ref 0.0–0.7)
Lymphs Abs: 1 10*3/uL (ref 0.7–4.0)
Neutrophils Relative %: 85 % — ABNORMAL HIGH (ref 43–77)

## 2012-04-17 LAB — CBC
MCH: 29.6 pg (ref 26.0–34.0)
Platelets: 276 10*3/uL (ref 150–400)
RBC: 4.36 MIL/uL (ref 3.87–5.11)

## 2012-04-17 MED ORDER — OXYCODONE-ACETAMINOPHEN 5-325 MG/5ML PO SOLN: 5.0000 mL | ORAL | Status: DC | PRN

## 2012-04-17 MED ORDER — IOHEXOL 300 MG/ML  SOLN: 20.0000 mL | Freq: Once | INTRAMUSCULAR | Status: DC | PRN

## 2012-04-17 NOTE — Discharge Summary (Signed)
Physician Discharge Summary  Patient ID: Jacqueline Morrison MRN: [id number] DOB/AGE: 12-19-1965 46 y.o.  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 rate 20, height 5\' 8"  (1.727 m), weight 375 lb 8 oz (170.326 kg), last menstrual period 03/27/2012, SpO2 97.00%. Sore in upper trocar but otherwise doing well  Disposition: 01-Home or Self Care  Discharge Orders    Future Appointments: Provider: Department: Dept Phone: Center:   05/03/2012 4:00 PM Valarie Merino, MD [facility] [phone] None     Future Orders Please Complete By Expires   Diet Carb Modified      Increase activity slowly      Discharge instructions      Comments:   Follow discharge orders per bariatric service   No dressing needed        Medication List  As of 04/17/2012 11:19 AM   TAKE these medications         acetaminophen 500 MG tablet   Commonly known as: TYLENOL   Take 500 mg by mouth every 6 (six) hours as needed. pain      CRESTOR 40 MG tablet   Generic drug: rosuvastatin   Take 40 mg by mouth daily.      multivitamin capsule   Take 1 capsule by mouth daily.      oxyCODONE-acetaminophen 5-325 MG/5ML solution   Commonly known as: ROXICET   Take 5-10 mLs by mouth every 4 (four) hours as needed.      ramipril 10 MG capsule   Commonly known as: ALTACE   Take 10 mg by mouth daily.           Follow-up Information    Follow up with Valarie Merino, MD.   Contact information:   [facility] [address], [address] [place] [address] [phone]          Signed: Valarie Merino 04/17/2012, 11:19 AM

## 2012-04-17 NOTE — Progress Notes (Signed)
Pt alert and oriented; VSS; awaiting UGI; c/o one episode of nausea this am when bathing and none since; denies vomiting;  Denies burping or flatus or BM at this time; voiding without difficulty; ambulating in hallways without difficulty; c/o some abdominal pain with relief from prn meds; pt already has follow up appts with [facility] and [facility]; pt aware of BELT program and support group; discharge instructions given and pt verbalized understanding of. ADJUSTABLE GASTRIC BAND DISCHARGE INSTRUCTIONS  Drs. Fredrik Rigger, [place], [place], and [place] Call if you have any problems.   Call [phone] and ask for the surgeon on call.    If you need immediate assistance come to the ER at [facility]. Tell the ER personnel that you are a new post-op gastric banding patient. Signs and symptoms to report:   Severe vomiting or nausea. If you cannot tolerate clear liquids for longer than 1 day, you need to call your surgeon.    Abdominal pain which does not get better after taking your pain medication   Fever greater than 101 F degree   Difficulty breathing   Chest pain    Redness, swelling, drainage, or foul odor at incision sites    If your incisions open or pull apart   Swelling or pain in calf (lower leg)   Diarrhea, frequent watery, uncontrolled bowel movements.   Constipation, (no bowel movements for 3 days) if this occurs, Take Milk of Magnesia, 2 tablespoons by mouth, 3 times a day for 2 days if needed.  Call your doctor if constipation continues. Stop taking Milk of Magnesia once you have had a bowel movement. You may also use Miralax according to the label instructions.   Anything you consider "abnormal for you".   Normal side effects after Surgery:   Unable to sleep at night or concentrate   Irritability   Being tearful (crying) or depressed   These are common complaints, possibly related to your anesthesia, stress of surgery and change in lifestyle, that usually go away a few weeks after surgery.   If these feelings continue, call your medical doctor.  Wound Care You may have surgical glue, steri-strips, or staples over your incisions after surgery.  Surgical glue:  Looks like a clear film over your incisions and will wear off gradually. Steri-strips: Strips of tape over your incisions. You may notice a yellowish color on the skin underneath the steri-strips. This is a substance used to make the steri-strips stick better. Do not pull the steri-strips off - let them fall off. Staples: Cherlynn Polo may be removed before you leave the hospital. If you go home with staples, call [facility] [phone]) for an appointment with your surgeon's nurse to have staples removed in 7 - 10 days. Showering: You may shower two days after your surgery unless otherwise instructed by your surgeon. Wash gently around wounds with warm soapy water, rinse well, and gently pat dry.  If you have a drain, you may need someone to hold this while you shower. Avoid tub baths until staples are removed and incisions are healed.    Medications   Medications should be liquid or crushed if larger than the size of a dime.  Extended release pills should not be crushed.   Depending on the size and number of medications you take, you may need to stagger/change the time you take your medications so that you do not over-fill your pouch.    Make sure you follow-up with your primary care physician to make medication  adjustments needed during rapid weight loss and life-style adjustment.   If you are diabetic, follow up with the doctor that prescribes your diabetes medication(s) within one week after surgery and check your blood sugar regularly.   Do not drive while taking narcotics!   Do not take acetaminophen (Tylenol) and Roxicet or Lortab Elixir at the same time since these pain medications contain acetaminophen.  Diet at home: (First 2 Weeks)  You will see the nutritionist two weeks after your surgery. She will  advance your diet if you are tolerating liquids well. Once at home, if you have severe vomiting or nausea and cannot tolerate clear liquids lasting longer than 1 day, call your surgeon.  For Same Day Surgery Discharge Patients: The day of surgery drink water only: 2 ounces every 4 hours. If you are tolerating water, begin drinking your high protein shake the next morning. For Overnight Stay Patients: Begin high protein shake 2 ounces every 3 hours, 5 - 6 times per day.  Gradually increase the amount you drink as tolerated.  You may find it easier to slowly sip shakes throughout the day.  It is important to get your proteins in first.   Protein Shake   Drink at least 2 ounces of shake 5-6 times per day   Each serving of protein shakes should have a minimum of 15 grams of protein and no more than 5 grams of carbohydrate    Increase the amount of protein shake you drink as tolerated   Protein powder may be added to fluids such as non-fat milk or Lactaid milk (limit to 20 grams added protein powder per serving   The initial goal is to drink at least 8 ounces of protein shake/drink per day (or as directed by the nutritionist). Some examples of protein shakes are [employer], Dillard's, EAS Edge HP, and [employer]jury. Hydration   Gradually increase the amount of water and other liquids as tolerated (See Acceptable Fluids)   Gradually increase the amount of protein shake as tolerated     Sip fluids slowly and throughout the day   May use Sugar substitutes, use sparingly (limit to 6 - 8 packets per day).  Your fluid goal is 64 ounces of fluid daily. It may take a few weeks to build up to this.         32 oz (or more) should be clear liquids and 32 oz (or more) should be full liquids.         Liquids should not contain sugar, caffeine, or carbonation!  Acceptable Fluids Clear Liquids:   Water or Sugar-free flavored water, Fruit H2O   Decaffeinated coffee or tea (sugar-free)   Crystal Lite, Wyler's  Lite, Minute Maid Lite   Sugar-free Jell-O   Bouillon or broth   Sugar-free Popsicle:   *Less than 20 calories each; Limit 1 per day   Full Liquids:              Protein Shakes/Drinks + 2 choices per day of other full liquids shown below.    Other full liquids must be: No more than 12 grams of Carbs per serving,  No more than 3 grams of Fat per serving   Strained low-fat cream soup   Non-Fat milk   Fat-free Lactaid Milk   Sugar-free yogurt (Dannon Lite & Fit) Vitamins and Minerals (Start 1 day after surgery unless otherwise directed)   1 Chewable Multivitamin / Multimineral Supplement (i.e. Centrum for Adults)   Chewable Calcium Citrate  with Vitamin D-3. Take 1500 mg each day.           (Example: 3 Chewable Calcium Plus 600 with Vitamin D-3 can be found at [facility])           Do not mix multivitamins containing iron with calcium supplements; take 2 hours   apart   Do not substitute Tums (calcium carbonate) for your calcium   Menstruating women and those at risk for anemia may need extra iron. Talk with your doctor to see if you need additional iron.     If you need extra iron:  Total daily Iron recommendations (including Vitamins) = 50 - 100 mg Iron/day Do not stop taking or change any vitamins or minerals until you talk to your nutritionist or surgeon. Your nutritionist and / or physician must approve all vitamin and mineral supplements. Exercise For maximum success, begin exercising as soon as your doctor recommends. Make sure your physician approves any physical activity.   Depending on fitness level, begin with a simple walking program   Walk 5-15 minutes each day, 7 days per week.    Slowly increase until you are walking 30-45 minutes per day   Consider joining our BELT program. [phone] or email [email or web address]@[employer] [email or web address] Things to remember:   You may have sexual relations when you feel comfortable. It is VERY important for female patients to use a reliable birth control method. Fertility  often increases after surgery. Do not get pregnant for at least 18 months.   It is very important to keep all follow up appointments with your surgeon, nutritionist, primary care physician, and behavioral health practitioner. After the first year, please follow up with your bariatric surgeon at least once a year in order to maintain best weight loss results.  [facility] [place] [facility]: [phone] [facility]: [phone]   Free counseling is available for you and your family through collaboration between [facility] and [place]. Please call [phone] and leave a message.    Consider purchasing a medical alert bracelet that says you had lap-band surgery.    The [facility] has a free Bariatric Surgery Support Group that meets monthly, the 3rd Thursday, 6 pm, Classroom #1, [place]. You may register online at [email or web address], but registration is not necessary. Select Classes and Support Groups, Bariatric Surgery, or Call [phone]   Do not return to work or drive until cleared by your surgeon   Use your CPAP when sleeping if applicable   Do not lift anything greater than ten pounds for at least two weeks.   You will probably have your first fill (fluid added to your band) 6 weeks after surgery  Talmadge Chad, RN BAriatric Nurse Coordinator

## 2012-04-30 ENCOUNTER — Encounter: Payer: 59 | Attending: Surgery | Admitting: *Deleted

## 2012-04-30 VITALS — Ht 68.0 in | Wt 364.3 lb

## 2012-04-30 DIAGNOSIS — Z713 Dietary counseling and surveillance: Secondary | ICD-10-CM | POA: Insufficient documentation

## 2012-04-30 DIAGNOSIS — Z01818 Encounter for other preprocedural examination: Secondary | ICD-10-CM | POA: Insufficient documentation

## 2012-04-30 DIAGNOSIS — E669 Obesity, unspecified: Secondary | ICD-10-CM

## 2012-05-01 ENCOUNTER — Encounter: Payer: Self-pay | Admitting: *Deleted

## 2012-05-01 NOTE — Patient Instructions (Signed)
Patient to follow Phase 3A-Soft, High Protein Diet and follow-up at [facility] in 6 weeks for 2 months post-op nutrition visit for diet advancement. 

## 2012-05-01 NOTE — Progress Notes (Signed)
  Bariatric Class:  Appt start time: 1400 end time:  1530.  2 Week Post-Operative Nutrition Class  Patient was seen on 04/30/12 for Post-Operative Nutrition education [facility].   Surgery date: 04/16/12 Surgery type: LAGB Start weight @ [facility]: 396.5 lbs  Weight today: 364.3 lbs Weight change: 32.2 lbs Total weight lost: 32.2 lbs BMI: 60.3 kg/m^2  The following the learning objective met the patient during this course:   Identifies Phase 3A (Soft, High Proteins) Dietary Goals and will begin from 2 weeks post-operatively to 2 months post-operatively  Identifies appropriate sources of fluids and proteins   States protein recommendations and appropriate sources post-operatively  Identifies the need for appropriate texture modifications, mastication, and bite sizes when consuming solids  Identifies appropriate multivitamin and calcium sources post-operatively  Describes the need for physical activity post-operatively and will follow MD recommendations  States when to call healthcare provider regarding medication questions or post-operative complications  Handouts given during class include:  Phase 3A: Soft, High Protein Diet Handout  Band Fill Guidelines Handout  Follow-Up Plan: Patient will follow-up at [facility] in 6 weeks for 2 months post-op nutrition visit for diet advancement per MD.

## 2012-05-03 ENCOUNTER — Encounter (INDEPENDENT_AMBULATORY_CARE_PROVIDER_SITE_OTHER): Payer: Self-pay | Admitting: Surgery

## 2012-05-03 ENCOUNTER — Ambulatory Visit (INDEPENDENT_AMBULATORY_CARE_PROVIDER_SITE_OTHER): Payer: 59 | Admitting: Surgery

## 2012-05-03 VITALS — BP 130/78 | HR 72 | Temp 97.4°F | Resp 16 | Ht 68.0 in | Wt 364.1 lb

## 2012-05-03 DIAGNOSIS — Z9884 Bariatric surgery status: Secondary | ICD-10-CM

## 2012-05-03 NOTE — Progress Notes (Signed)
Jacqueline Morrison 46 y.o.  Body mass index is 55.37 kg/(m^2).  Patient Active Problem List  Diagnosis  . Obesity-BMI 58  . Lapband APS July 2013 + HH repair    No Known Allergies  Past Surgical History  Procedure Date  . Appendectomy   . Tubal ligation   . Hysteroscopy 1998  . Dilation and curettage of uterus 1998  . Endometrial ablation 2006  . Laparoscopic gastric banding 04/16/2012    Procedure: LAPAROSCOPIC GASTRIC BANDING;  Surgeon: Valarie Merino, MD;  Location: [facility];  Service: General;  Laterality: N/A;  . Hiatal hernia repair 04/16/2012    Procedure: LAPAROSCOPIC REPAIR OF HIATAL HERNIA;  Surgeon: Valarie Merino, MD;  Location: [facility];  Service: General;;   Garth Schlatter, MD No diagnosis found.  Incision OK.  Doing well.  Will be ready for first band fill around August 20 Matt B. Daphine Deutscher, MD, [facility] [phone] beeper [phone]  05/03/2012 5:26 PM

## 2012-05-03 NOTE — Patient Instructions (Addendum)
Stay on diet as prescribed First band fill around 20 August OK to go into pool to swim

## 2012-06-12 ENCOUNTER — Ambulatory Visit (INDEPENDENT_AMBULATORY_CARE_PROVIDER_SITE_OTHER): Payer: 59 | Admitting: Surgery

## 2012-06-12 ENCOUNTER — Encounter: Payer: 59 | Attending: Surgery | Admitting: *Deleted

## 2012-06-12 ENCOUNTER — Encounter: Payer: Self-pay | Admitting: *Deleted

## 2012-06-12 ENCOUNTER — Encounter (INDEPENDENT_AMBULATORY_CARE_PROVIDER_SITE_OTHER): Payer: Self-pay | Admitting: Surgery

## 2012-06-12 VITALS — BP 122/68 | HR 80 | Temp 97.4°F | Resp 16 | Ht 68.0 in | Wt 357.4 lb

## 2012-06-12 VITALS — Ht 68.0 in | Wt 356.5 lb

## 2012-06-12 DIAGNOSIS — Z9884 Bariatric surgery status: Secondary | ICD-10-CM

## 2012-06-12 DIAGNOSIS — E669 Obesity, unspecified: Secondary | ICD-10-CM

## 2012-06-12 DIAGNOSIS — Z01818 Encounter for other preprocedural examination: Secondary | ICD-10-CM | POA: Insufficient documentation

## 2012-06-12 DIAGNOSIS — Z713 Dietary counseling and surveillance: Secondary | ICD-10-CM | POA: Insufficient documentation

## 2012-06-12 NOTE — Patient Instructions (Addendum)
Goals:  Follow Phase 3B: High Protein + Non-Starchy Vegetables  Eat 3-6 small meals/snacks, every 3-5 hrs  Increase lean protein foods to meet 60-80g goal  Increase fluid intake to 64oz +  Avoid drinking 15 minutes before, during and 30 minutes after eating  Aim for >30 min of physical activity daily 

## 2012-06-12 NOTE — Progress Notes (Signed)
   Follow-up visit:  8 Weeks Post-Operative LAGB Surgery  Medical Nutrition Therapy:  Appt start time: 1745  End time: 1815.  Primary concerns today: Post-operative Bariatric Surgery Nutrition Management.  Surgery date: 04/16/12 Surgery type: LAGB Start weight @ [facility]: 396.5 lbs  Weight today: 356.5 lbs Weight change: 7.8 lbs Total weight lost: 40.0 lbs BMI: 54.2 kg/m^2  Weight goal: 175-200 lbs % Weight goal met: 18-20%  TANITA  BODY COMP RESULTS  06/12/12   %Fat 51.4%   Fat Mass (lbs) 183.0   Fat Free Mass (lbs) 173.5   Total Body Water (lbs) 127.0   24-hr recall: B (AM): Protein shake (Atkins or Premier) OR 2 boiled eggs - 15-30g Snk (AM): None  L (PM): Grilled [place] burger w/ green beans OR [place] rolls and cheese - 30g Snk (PM): Yogurt (Dannon lit & fit CHO control - 4 oz) - 10g D (PM): Same as lunch - 30g Snk (PM): [place] roll or water = 0-15g  Fluid intake: ~64 oz Estimated total protein intake: 85-110g  Medications: No changes reported Supplementation: Taking as directed  Using straws: No Drinking while eating: No Hair loss: No Carbonated beverages: No N/V/D/C: A few episodes of diarrhea; some nausea midway through meals; goes away once she stands up.  Last Lap-Band fill: Today (9/4) w/ Dr. Daphine Deutscher; sipping water at visit  Recent physical activity:  Walking 2-3 times/week @ 60 min ea  Progress Towards Goal(s):  In progress.  Handouts given during visit include:  Phase 3B: High Proteins + Non-Starchy Vegetables   Nutritional Diagnosis:  [place]-3.3 Overweight/obesity As related to recent LAGB surgery.  As evidenced by patient following post-op nutrition guidelines to meet weight loss goal of 175-200 lbs.    Intervention:  Nutrition education/diet advancement.  Monitoring/Evaluation:  Dietary intake, exercise, lap band fills, and body weight. Follow up in 1 months for 3 month post-op visit.

## 2012-06-12 NOTE — Patient Instructions (Addendum)

## 2012-06-12 NOTE — Progress Notes (Signed)
[name] Body mass index is 54.34 kg/(m^2).  Having regurgitation:  no  Nocturnal reflux?  no  Amount of fill  1.5  Jacqueline Morrison comes in today for her first fill. I believe she has an appointment with sterile later this afternoon for dietary consult. She is doing well postop and has lost 18 pounds since surgery, 6.8 since her last visit. I went ahead and gave her a 1.5 cc fill today.  She is having NO GER after her posterior hiatal hernia repair.   Will get her back for a followup band fill in 4 weeks probably in the band fill clinic.  Doing well

## 2012-07-08 ENCOUNTER — Encounter: Payer: Self-pay | Admitting: *Deleted

## 2012-07-08 ENCOUNTER — Encounter: Payer: 59 | Admitting: *Deleted

## 2012-07-08 VITALS — Ht 68.0 in | Wt 345.5 lb

## 2012-07-08 DIAGNOSIS — E669 Obesity, unspecified: Secondary | ICD-10-CM

## 2012-07-08 NOTE — Progress Notes (Addendum)
Follow-up visit:  12 Weeks Post-Operative LAGB Surgery  Medical Nutrition Therapy:  Appt start time: 1600  End time: 1630.  Primary concerns today: Post-operative Bariatric Surgery Nutrition Management.  Surgery date: 04/16/12 Surgery type: LAGB Start weight @ [facility]: 396.5 lbs  Weight today: 345.5 lbs Weight change: 11.0 lbs Total weight lost: 51.0 lbs BMI: 52.5 kg/m^2  Weight goal: 175-200 lbs % Weight goal met:  23-26%  TANITA  BODY COMP RESULTS  06/12/12 07/08/12   %Fat 51.4% 53.1%   Fat Mass (lbs) 183.0 183.5   Fat Free Mass (lbs) 173.5 162.0   Total Body Water (lbs) 127.0 118.5   24-hr recall: B (AM): Protein shake (Atkins or Premier) OR 2 boiled eggs OR [place]awi sausage (3 oz) - 15-30g Snk (AM): None  L (PM): [place] rolls and cheese - 30g Snk (PM): Yogurt (Dannon lit & fit greek - 4 oz) - 10g D (PM): Same as lunch - 30g Snk (PM): [place] roll or water = 0-15g  Fluid intake: ~64 oz Estimated total protein intake: 85-110g  Medications: No changes reported Supplementation: Taking as directed  Using straws: No Drinking while eating: No Hair loss: Mild Carbonated beverages: No N/V/D/C: Some nausea several hours after meals; sometimes happens at the start of meals. Will follow.  Last Lap-Band fill:  9/4 w/ Dr. Daphine Deutscher; feels she is on border of the green/yellow zone. Next appt on 10/3 with Mardelle Matte.   Recent physical activity:  Usually walks 2-3 times/week @ 60 min ea ; Last 2 weeks = ~30 min d/t arthritic pain in knees  Progress Towards Goal(s):  In progress.  Nutritional Diagnosis:  [place]-3.3 Overweight/obesity As related to recent LAGB surgery.  As evidenced by patient following post-op nutrition guidelines to meet weight loss goal of 175-200 lbs.    Intervention:  Nutrition education/diet advancement.  Monitoring/Evaluation:  Dietary intake, exercise, lap band fills, and body weight. Follow up in 3 months for 6 month post-op visit.

## 2012-07-08 NOTE — Patient Instructions (Addendum)
Goals:  Follow Phase 3B: High Protein + Non-Starchy Vegetables  Eat 3-6 small meals/snacks, every 3-5 hrs  Increase lean protein foods to meet 60-80g goal  Increase fluid intake to 64oz +  Keep carbs to 15 g or less per meal.  10g or less per snack; always have protein with carbs.   Aim for >30 min of physical activity daily - Try the [facility]) or [facility]

## 2012-07-11 ENCOUNTER — Encounter (INDEPENDENT_AMBULATORY_CARE_PROVIDER_SITE_OTHER): Payer: Self-pay

## 2012-07-11 ENCOUNTER — Ambulatory Visit (INDEPENDENT_AMBULATORY_CARE_PROVIDER_SITE_OTHER): Payer: 59 | Admitting: Physician Assistant

## 2012-07-11 DIAGNOSIS — Z9884 Bariatric surgery status: Secondary | ICD-10-CM

## 2012-07-11 NOTE — Progress Notes (Signed)
  HISTORY: Jacqueline Morrison is a 46 y.o.female who received an AP-Standard lap-band in July 2013 by Dr. Daphine Deutscher. She comes in with no persistent hunger complaints or persistent regurgitation or reflux. She does report occasional, sporadic nausea which started before her first adjustment a month ago. Since then she's noticed a slight increase in frequency of nausea. She denies pregnancy. She denies difficulty swallowing solid food. She's engaged in a regular walking program.  VITAL SIGNS: Filed Vitals:   07/11/12 0839  BP: 116/84  Pulse: 68    PHYSICAL EXAM: Physical exam reveals a very well-appearing 46 y.o.female in no apparent distress Neurologic: Awake, alert, oriented Psych: Bright affect, conversant Respiratory: Breathing even and unlabored. No stridor or wheezing Extremities: Atraumatic, good range of motion. Skin: Warm, Dry, no rashes Musculoskeletal: Normal gait, Joints normal  ASSESMENT: 46 y.o.  female  s/p AP-Standard lap-band.   PLAN: Given the good reports of hunger and portion sizes and the fact that she's had episodes of nausea, we opted to defer an adjustment today. I don't have a clear explanation of the nausea complaints - an overtightened band would likely yield regurgitation. She's offered to keep a diary of these episodes and will share that with [place] at her next appointment. I asked her to continue her current diet and exercise regimen and to return to the office sooner if symptoms worsen. She voiced agreement.

## 2012-07-11 NOTE — Patient Instructions (Signed)
Return in one month. Focus on good food choices as well as physical activity. Return sooner if you have an increase in hunger, portion sizes or weight. Return also for difficulty swallowing, night cough, reflux or if your nausea worsens.

## 2012-08-22 ENCOUNTER — Encounter (INDEPENDENT_AMBULATORY_CARE_PROVIDER_SITE_OTHER): Payer: Self-pay

## 2012-08-22 ENCOUNTER — Encounter (INDEPENDENT_AMBULATORY_CARE_PROVIDER_SITE_OTHER): Payer: 59

## 2012-08-22 ENCOUNTER — Ambulatory Visit (INDEPENDENT_AMBULATORY_CARE_PROVIDER_SITE_OTHER): Payer: 59 | Admitting: Physician Assistant

## 2012-08-22 VITALS — BP 138/88 | HR 77 | Temp 98.0°F | Resp 18 | Ht 68.0 in | Wt 341.8 lb

## 2012-08-22 DIAGNOSIS — Z4651 Encounter for fitting and adjustment of gastric lap band: Secondary | ICD-10-CM

## 2012-08-22 NOTE — Progress Notes (Signed)
  HISTORY: Jacqueline Morrison is a 46 y.o.female who received an AP-Standard lap-band in July 2013 by Dr. Daphine Deutscher. She comes in with improvement in her nausea. She's had no vomiting. She has noticed an increase in her desire for seconds but has resisted by taking water 30 minutes after meals. She believes a small fill would help.  VITAL SIGNS: Filed Vitals:   08/22/12 1552  BP: 138/88  Pulse: 77  Temp: 98 F (36.7 C)  Resp: 18    PHYSICAL EXAM: Physical exam reveals a very well-appearing 46 y.o.female in no apparent distress Neurologic: Awake, alert, oriented Psych: Bright affect, conversant Respiratory: Breathing even and unlabored. No stridor or wheezing Abdomen: Soft, nontender, nondistended to palpation. Incisions well-healed. No incisional hernias. Port easily palpated. Extremities: Atraumatic, good range of motion.  ASSESMENT: 45 y.o.  female  s/p AP-Standard lap-band.   PLAN: The patient's port was accessed with a 20G Huber needle without difficulty. Clear fluid was aspirated and 0.5 mL saline was added to the port. The patient was able to swallow water without difficulty following the procedure and was instructed to take clear liquids for the next 24-48 hours and advance slowly as tolerated.

## 2012-08-22 NOTE — Patient Instructions (Signed)
Take clear liquids tonight. Thin protein shakes are ok to start tomorrow morning. Slowly advance your diet thereafter. Call us if you have persistent vomiting or regurgitation, night cough or reflux symptoms. Return as scheduled or sooner if you notice no changes in hunger/portion sizes.  

## 2012-09-26 ENCOUNTER — Encounter (INDEPENDENT_AMBULATORY_CARE_PROVIDER_SITE_OTHER): Payer: 59

## 2012-10-07 ENCOUNTER — Ambulatory Visit: Payer: 59 | Admitting: *Deleted

## 2012-10-08 ENCOUNTER — Ambulatory Visit: Payer: Self-pay | Admitting: *Deleted

## 2012-10-16 NOTE — Progress Notes (Signed)
Pt not seen by me

## 2012-10-17 ENCOUNTER — Encounter (INDEPENDENT_AMBULATORY_CARE_PROVIDER_SITE_OTHER): Payer: 59

## 2012-10-22 ENCOUNTER — Encounter: Payer: 59 | Attending: Surgery | Admitting: *Deleted

## 2012-10-22 ENCOUNTER — Encounter: Payer: Self-pay | Admitting: *Deleted

## 2012-10-22 VITALS — Ht 68.0 in | Wt 335.0 lb

## 2012-10-22 DIAGNOSIS — Z09 Encounter for follow-up examination after completed treatment for conditions other than malignant neoplasm: Secondary | ICD-10-CM | POA: Insufficient documentation

## 2012-10-22 DIAGNOSIS — Z713 Dietary counseling and surveillance: Secondary | ICD-10-CM | POA: Insufficient documentation

## 2012-10-22 DIAGNOSIS — Z9884 Bariatric surgery status: Secondary | ICD-10-CM | POA: Insufficient documentation

## 2012-10-22 DIAGNOSIS — E669 Obesity, unspecified: Secondary | ICD-10-CM

## 2012-10-22 NOTE — Patient Instructions (Addendum)
Goals:  Follow Phase 3B: High Protein + Non-Starchy Vegetables  Eat 3-6 small meals/snacks, every 3-5 hrs  Increase lean protein foods to meet 60-80g goal  Increase fluid intake to 64oz +  Keep carbs to 15 g or less per meal and 10g or less per snack; always have protein with carbs.   Aim for >30 min of physical activity daily - Try the [facility]) or [employer]    TANITA  BODY COMP RESULTS  06/12/12 07/08/12 10/23/12   BMI  54.2 52.5 50.9   Fat Mass (lbs) 183.0 183.5 177.0   Fat Free Mass (lbs) 173.5 162.0 158.0   Total Body Water (lbs) 127.0 118.5 115.5

## 2012-10-22 NOTE — Progress Notes (Signed)
Follow-up visit:  6 Months Post-Operative LAGB Surgery  Medical Nutrition Therapy:  Appt start time:  0830  End time: 0900.  Primary concerns today: Post-operative Bariatric Surgery Nutrition Management.  Surgery date: 04/16/12 Surgery type: LAGB Start weight @ [facility]: 396.5 lbs  Weight today:  335.0 lbs Weight change: 10.5 lbs Total weight lost:  61.5 lbs  Weight goal: 175-200 lbs % Weight goal met:  28-31%  TANITA  BODY COMP RESULTS  06/12/12 07/08/12 10/23/12   BMI  54.2 52.5 50.9   Fat Mass (lbs) 183.0 183.5 177.0   Fat Free Mass (lbs) 173.5 162.0 158.0   Total Body Water (lbs) 127.0 118.5 115.5   24-hr recall: B (AM): Protein shake (Atkins or Premier) OR 2 boiled eggs OR [place] sausage (3 oz) - 15-30g Snk (AM): sm cup of coffee + 1.5 bottles water  L (12-1 PM): Lean Cuisine and small salad OR [place] rolls and cheese with greens or salad - 30g Snk (2:30-3 PM): Peanuts, almonds, or pistachios (1-1.5 oz) OR Yogurt (Dannon lit & fit greek - 4 oz) - 10g Snk (5-7 PM): [place] roll or water = 0-15g D (PM): 1 slice pizza OR lean meat and vegetable OR scrambled eggs, 1 pc bacon OR 1 slice liver pudding - 10-20g  Fluid intake:  Slightly decreased since stress started in Nov 2013 = 55-60 oz Estimated total protein intake: 70-100g  Medications: No changes reported Supplementation: Taking as directed  Using straws: No Drinking while eating: No Hair loss:  Resolving.  Carbonated beverages: No N/V/D/C:  Some nausea continues at various times; Not daily.   Last Lap-Band fill:  08/22/12 = 0.5 cc fill - next appt 10/24/12 with Mardelle Matte.     Recent physical activity:  Decreased since 07/2012 d/t worsening right knee pain. Plans to start swimming at the [facility] a few times/week.   Progress Towards Goal(s):  In progress.  Nutritional Diagnosis:  [place]-3.3 Overweight/obesity As related to recent LAGB surgery.  As evidenced by patient following post-op nutrition guidelines to meet weight loss goal of  175-200 lbs.    Intervention:  Nutrition education/diet advancement.  Monitoring/Evaluation:  Dietary intake, exercise, lap band fills, and body weight. Follow up in 6 months for 12 month post-op visit.

## 2012-10-24 ENCOUNTER — Ambulatory Visit (INDEPENDENT_AMBULATORY_CARE_PROVIDER_SITE_OTHER): Payer: 59 | Admitting: Physician Assistant

## 2012-10-24 ENCOUNTER — Encounter (INDEPENDENT_AMBULATORY_CARE_PROVIDER_SITE_OTHER): Payer: Self-pay

## 2012-10-24 ENCOUNTER — Encounter (INDEPENDENT_AMBULATORY_CARE_PROVIDER_SITE_OTHER): Payer: 59

## 2012-10-24 VITALS — BP 116/82 | HR 62 | Temp 97.5°F | Ht 68.0 in | Wt 337.4 lb

## 2012-10-24 DIAGNOSIS — Z4651 Encounter for fitting and adjustment of gastric lap band: Secondary | ICD-10-CM

## 2012-10-24 NOTE — Progress Notes (Signed)
  HISTORY: Jacqueline Morrison is a 47 y.o.female who received an AP-Standard lap-band in July 2013 by Dr. Daphine Deutscher. She comes in with no particular complaints of hunger but she is less satisfied with smaller portions. She stops herself before eating more but is capable of doing so. She denies regurgitation or reflux symptoms. She would like a fill today.  VITAL SIGNS: Filed Vitals:   10/24/12 1117  BP: 116/82  Pulse: 62  Temp: 97.5 F (36.4 C)    PHYSICAL EXAM: Physical exam reveals a very well-appearing 47 y.o.female in no apparent distress Neurologic: Awake, alert, oriented Psych: Bright affect, conversant Respiratory: Breathing even and unlabored. No stridor or wheezing Abdomen: Soft, nontender, nondistended to palpation. Incisions well-healed. No incisional hernias. Port easily palpated. Extremities: Atraumatic, good range of motion.  ASSESMENT: 47 y.o.  female  s/p AP-Standard lap-band.   PLAN: The patient's port was accessed with a 20G Huber needle without difficulty. Clear fluid was aspirated and 0.5 mL saline was added to the port. The patient was able to swallow water without difficulty following the procedure and was instructed to take clear liquids for the next 24-48 hours and advance slowly as tolerated.

## 2012-10-24 NOTE — Patient Instructions (Signed)
Take clear liquids tonight. Thin protein shakes are ok to start tomorrow morning. Slowly advance your diet thereafter. Call us if you have persistent vomiting or regurgitation, night cough or reflux symptoms. Return as scheduled or sooner if you notice no changes in hunger/portion sizes.  

## 2012-12-05 ENCOUNTER — Encounter (INDEPENDENT_AMBULATORY_CARE_PROVIDER_SITE_OTHER): Payer: 59

## 2013-01-02 ENCOUNTER — Ambulatory Visit (INDEPENDENT_AMBULATORY_CARE_PROVIDER_SITE_OTHER): Payer: 59 | Admitting: Physician Assistant

## 2013-01-02 ENCOUNTER — Encounter (INDEPENDENT_AMBULATORY_CARE_PROVIDER_SITE_OTHER): Payer: Self-pay | Admitting: Physician Assistant

## 2013-01-02 DIAGNOSIS — Z9884 Bariatric surgery status: Secondary | ICD-10-CM

## 2013-01-02 NOTE — Progress Notes (Signed)
  HISTORY: TOPACIO CELLA is a 48 y.o.female who received an AP-Standard lap-band in July 2013 by Dr. Daphine Deutscher. She comes in with 7 lbs weight loss since her last visit. She had a bout of Norovirus a couple of weeks ago but since then has had no complaints. She denies regurgitation or reflux symptoms. Her hunger is under good control and her portion sizes remain small. She gets in some walking when her knee pain allows. She doesn't want a fill today.  VITAL SIGNS: Filed Vitals:   01/02/13 1547  BP: 142/86  Pulse: 81  Temp: 98.4 F (36.9 C)  Resp: 16    PHYSICAL EXAM: Physical exam reveals a very well-appearing 47 y.o.female in no apparent distress Neurologic: Awake, alert, oriented Psych: Bright affect, conversant Respiratory: Breathing even and unlabored. No stridor or wheezing Extremities: Atraumatic, good range of motion. Skin: Warm, Dry, no rashes Musculoskeletal: Normal gait, Joints normal  ASSESMENT: 47 y.o.  female  s/p AP-Standard lap-band.   PLAN: As it sounds like she's in the green zone, we deferred an adjustment today. We'll have her return in a month unless she becomes more hungry in the meantime.

## 2013-01-02 NOTE — Patient Instructions (Signed)
Return in one month. Focus on good food choices as well as physical activity. Return sooner if you have an increase in hunger, portion sizes or weight. Return also for difficulty swallowing, night cough, reflux.   

## 2013-01-30 ENCOUNTER — Encounter (INDEPENDENT_AMBULATORY_CARE_PROVIDER_SITE_OTHER): Payer: 59

## 2013-02-20 ENCOUNTER — Ambulatory Visit (INDEPENDENT_AMBULATORY_CARE_PROVIDER_SITE_OTHER): Payer: 59 | Admitting: Physician Assistant

## 2013-02-20 ENCOUNTER — Encounter (INDEPENDENT_AMBULATORY_CARE_PROVIDER_SITE_OTHER): Payer: Self-pay

## 2013-02-20 VITALS — BP 128/82 | HR 64 | Temp 97.7°F | Resp 18 | Ht 68.0 in | Wt 331.8 lb

## 2013-02-20 DIAGNOSIS — Z4651 Encounter for fitting and adjustment of gastric lap band: Secondary | ICD-10-CM

## 2013-02-20 NOTE — Patient Instructions (Signed)
Take clear liquids tonight. Thin protein shakes are ok to start tomorrow morning. Slowly advance your diet thereafter. Call us if you have persistent vomiting or regurgitation, night cough or reflux symptoms. Return as scheduled or sooner if you notice no changes in hunger/portion sizes.  

## 2013-02-20 NOTE — Progress Notes (Signed)
  HISTORY: Jacqueline Morrison is a 47 y.o.female who received an AP-Standard lap-band in July 2013 by Dr. Daphine Deutscher. She comes in with 1 lb weight gain since her last visit. She has had some significant social stressors of late, including the death of her closest friend. She is now caring for the friend's 75 year old daughter. She recalls making some bad food choices during that time, but now that things have settled down, she is able to focus more on her eating. She does describe increasing hunger, especially in the middle of the night which is new for her. She would like an adjustment today to help keep on track.  VITAL SIGNS: Filed Vitals:   02/20/13 1419  BP: 128/82  Pulse: 64  Temp: 97.7 F (36.5 C)  Resp: 18    PHYSICAL EXAM: Physical exam reveals a very well-appearing 47 y.o.female in no apparent distress Neurologic: Awake, alert, oriented Psych: Bright affect, conversant Respiratory: Breathing even and unlabored. No stridor or wheezing Abdomen: Soft, nontender, nondistended to palpation. Incisions well-healed. No incisional hernias. Port easily palpated. Extremities: Atraumatic, good range of motion.  ASSESMENT: 47 y.o.  female  s/p AP-Standard lap-band.   PLAN: The patient's port was accessed with a 20G Huber needle without difficulty. Clear fluid was aspirated and 0.25 mL saline was added to the port. The patient was able to swallow water without difficulty following the procedure and was instructed to take clear liquids for the next 24-48 hours and advance slowly as tolerated.

## 2013-03-20 ENCOUNTER — Encounter (INDEPENDENT_AMBULATORY_CARE_PROVIDER_SITE_OTHER): Payer: Self-pay

## 2013-03-20 ENCOUNTER — Ambulatory Visit (INDEPENDENT_AMBULATORY_CARE_PROVIDER_SITE_OTHER): Payer: 59 | Admitting: Physician Assistant

## 2013-03-20 VITALS — BP 130/78 | HR 82 | Temp 97.6°F | Resp 16 | Ht 68.0 in | Wt 328.4 lb

## 2013-03-20 DIAGNOSIS — Z9884 Bariatric surgery status: Secondary | ICD-10-CM

## 2013-03-20 NOTE — Progress Notes (Signed)
  HISTORY: Jacqueline Morrison is a 47 y.o.female who received an AP-Standard lap-band in July 2013 by Dr. Daphine Deutscher. She comes in with 3 lbs weight loss since her last visit. She reports good control of her hunger and small portion sizes. She's trying hard to make good food choices and is overall successful with this but at times it can be difficult. She has picked up a second job so this has impacted her formal exercise time. She's compensating by taking stairs frequently and parking distant from stores, etc. She does not feel compelled to have a fill today.  VITAL SIGNS: Filed Vitals:   03/20/13 1044  BP: 130/78  Pulse: 82  Temp: 97.6 F (36.4 C)  Resp: 16    PHYSICAL EXAM: Physical exam reveals a very well-appearing 47 y.o.female in no apparent distress Neurologic: Awake, alert, oriented Psych: Bright affect, conversant Respiratory: Breathing even and unlabored. No stridor or wheezing Extremities: Atraumatic, good range of motion. Skin: Warm, Dry, no rashes Musculoskeletal: Normal gait, Joints normal  ASSESMENT: 47 y.o.  female  s/p AP-Standard lap-band.   PLAN: She's in the green zone so we'll defer an adjustment today. We'll have her back in a month for her one year anniversary appointment.

## 2013-03-20 NOTE — Patient Instructions (Signed)
Return in one month. Focus on good food choices as well as physical activity. Return sooner if you have an increase in hunger, portion sizes or weight. Return also for difficulty swallowing, night cough, reflux.   

## 2013-04-17 ENCOUNTER — Encounter (INDEPENDENT_AMBULATORY_CARE_PROVIDER_SITE_OTHER): Payer: 59

## 2013-04-21 ENCOUNTER — Ambulatory Visit: Payer: 59 | Admitting: *Deleted

## 2013-05-01 ENCOUNTER — Encounter (INDEPENDENT_AMBULATORY_CARE_PROVIDER_SITE_OTHER): Payer: 59

## 2013-05-06 ENCOUNTER — Ambulatory Visit: Payer: 59 | Admitting: *Deleted

## 2013-05-20 ENCOUNTER — Encounter: Payer: Self-pay | Admitting: *Deleted

## 2013-05-20 ENCOUNTER — Encounter: Payer: 59 | Attending: Surgery | Admitting: *Deleted

## 2013-05-20 VITALS — Ht 68.0 in | Wt 327.5 lb

## 2013-05-20 DIAGNOSIS — Z713 Dietary counseling and surveillance: Secondary | ICD-10-CM | POA: Insufficient documentation

## 2013-05-20 DIAGNOSIS — Z09 Encounter for follow-up examination after completed treatment for conditions other than malignant neoplasm: Secondary | ICD-10-CM | POA: Insufficient documentation

## 2013-05-20 DIAGNOSIS — E669 Obesity, unspecified: Secondary | ICD-10-CM

## 2013-05-20 DIAGNOSIS — Z9884 Bariatric surgery status: Secondary | ICD-10-CM | POA: Insufficient documentation

## 2013-05-20 NOTE — Progress Notes (Addendum)
Follow-up visit:  13 Months Post-Operative LAGB Surgery  Medical Nutrition Therapy:  Appt start time:  0930  End time:  1000.  Primary concerns today: Post-operative Bariatric Surgery Nutrition Management. Sometimes can eat more than others. Stress increased since Feb 14, 2023 d/t friend's death and getting daughter ready for college. Added 2nd job and knee is giving her trouble, so hasn't been able to exercise as much. Getting only 3-4 hours of sleep nightly d/t new job/not sleeping well.   Surgery date: 04/16/12 Surgery type: LAGB Start weight @ [facility]: 396.5 lbs  Weight today:  327.5 lbs Weight change: 7.5 lbs Total weight lost:  69.0 lbs  Weight goal: 175-200 lbs % Weight goal met:  31-35%  TANITA  BODY COMP RESULTS  06/12/12 07/08/12 10/23/12 05/20/13   BMI  54.2 52.5 50.9 49.8   Fat Mass (lbs) 183.0 183.5 177.0 171.5   Fat Free Mass (lbs) 173.5 162.0 158.0 156.0   Total Body Water (lbs) 127.0 118.5 115.5 114.0   24-hr recall:  Veggie straws, pretzel chips, nuts, protein shakes (4-5 times/week), fish.   Fluid intake:  55-60 oz Estimated total protein intake: 60g  Medications: No changes reported Supplementation:  Taking inconsistently. Still has problems swallowing pills.  Using straws: No Drinking while eating: No Hair loss:  Resolved  Carbonated beverages:  Every now and then, a few sips of Mtn Dew N/V/D/C:  Some nausea continues at various times; inconsistent.   Last Lap-Band fill:  02/20/13 = 0.25 cc.  States, "my body is all over the place" - reports she can eat things she shouldn't, but stops herself. Cookie and piece of cheese cake once every other week or so. May have a small piece of chocolate instead. Still has difficulty with chicken breast and beef (roast beef, steak, hamburger, etc). Can do dark chicken meat.   Recent physical activity:  None since 02/2013 d/t knee pain and 2nd job   Progress Towards Goal(s):  In progress.  Nutritional Diagnosis:  [place]-3.3  Overweight/obesity As related to recent LAGB surgery.  As evidenced by patient following post-op nutrition guidelines to meet weight loss goal of 175-200 lbs.    Intervention:  Nutrition education/reinforcement.  Monitoring/Evaluation:  Dietary intake, exercise, lap band fills, and body weight. Follow up in 3 months for 16 month post-op visit.

## 2013-05-20 NOTE — Patient Instructions (Addendum)
Goals:  Follow Phase 3B: High Protein + Non-Starchy Vegetables  Increase lean protein foods to meet 60-80g goal  Make sure to have protein with ALL carbs  Increase fluid intake to 64oz +  Keep carbs to 15 g or less per meal and 10g or less per snack; always have protein with carbs.   Aim for >30 min of physical activity daily as able per pain level - Try the [facility]) or [facility]

## 2013-05-29 ENCOUNTER — Encounter (INDEPENDENT_AMBULATORY_CARE_PROVIDER_SITE_OTHER): Payer: Self-pay

## 2013-05-29 ENCOUNTER — Ambulatory Visit (INDEPENDENT_AMBULATORY_CARE_PROVIDER_SITE_OTHER): Payer: 59 | Admitting: Physician Assistant

## 2013-05-29 VITALS — BP 122/78 | HR 68 | Temp 98.1°F | Resp 16 | Ht 68.5 in | Wt 331.8 lb

## 2013-05-29 DIAGNOSIS — Z9884 Bariatric surgery status: Secondary | ICD-10-CM

## 2013-05-29 DIAGNOSIS — D1803 Hemangioma of intra-abdominal structures: Secondary | ICD-10-CM

## 2013-05-29 NOTE — Patient Instructions (Signed)
Obtain your liver ultrasound. Return in three months. Focus on good food choices as well as physical activity. Return sooner if you have an increase in hunger, portion sizes or weight. Return also for difficulty swallowing, night cough, reflux.

## 2013-05-29 NOTE — Progress Notes (Signed)
  HISTORY: Jacqueline Morrison is a 47 y.o.female who received an AP-Standard lap-band in July 2013 by Dr. Daphine Deutscher. She comes in with three pounds of weight loss since her last visit in June. She has seen [place] Dec in nutrition in the past week. She denies persistent hunger or increased portion sizes. Getting regular exercise has been a challenge as she's working two jobs daily, getting home at 1 am some nights. She also has persistent knee pain which limits her activity. She is planning to see orthopaedics in the next month.  VITAL SIGNS: Filed Vitals:   05/29/13 0852  BP: 122/78  Pulse: 68  Temp: 98.1 F (36.7 C)  Resp: 16    PHYSICAL EXAM: Physical exam reveals a very well-appearing 47 y.o.female in no apparent distress Neurologic: Awake, alert, oriented Psych: Bright affect, conversant Respiratory: Breathing even and unlabored. No stridor or wheezing Extremities: Atraumatic, good range of motion. Skin: Warm, Dry, no rashes Musculoskeletal: Normal gait, Joints normal  ASSESMENT: 47 y.o.  female  s/p AP-Standard lap-band. History of liver hemangioma, incidentally found on abdominal ultrasound.  PLAN: She feels like she's in the green zone. Her goals are to increase her physical activity as time (and knee pain) permits. We deferred a fill today as a result. She needs a follow-up liver ultrasound to re-evaluate the hemangioma. Return in three months.

## 2013-06-03 ENCOUNTER — Ambulatory Visit
Admission: RE | Admit: 2013-06-03 | Discharge: 2013-06-03 | Disposition: A | Discharge status: Home / Self Care | Payer: 59 | Source: Ambulatory Visit | Attending: Physician Assistant | Admitting: Physician Assistant

## 2013-06-03 DIAGNOSIS — D1803 Hemangioma of intra-abdominal structures: Secondary | ICD-10-CM

## 2013-08-21 ENCOUNTER — Encounter: Payer: 59 | Attending: Surgery | Admitting: Dietician

## 2013-08-21 VITALS — Ht 68.0 in | Wt 337.0 lb

## 2013-08-21 DIAGNOSIS — Z713 Dietary counseling and surveillance: Secondary | ICD-10-CM | POA: Insufficient documentation

## 2013-08-21 DIAGNOSIS — E669 Obesity, unspecified: Secondary | ICD-10-CM

## 2013-08-21 DIAGNOSIS — Z9884 Bariatric surgery status: Secondary | ICD-10-CM | POA: Insufficient documentation

## 2013-08-21 DIAGNOSIS — Z09 Encounter for follow-up examination after completed treatment for conditions other than malignant neoplasm: Secondary | ICD-10-CM | POA: Insufficient documentation

## 2013-08-21 NOTE — Progress Notes (Signed)
Follow-up visit:  17 Months Post-Operative LAGB Surgery  Medical Nutrition Therapy:  Appt start time:  0800  End time:  0830.  Primary concerns today: Post-operative Bariatric Surgery Nutrition Management. Lost about 3 pounds in the past week and a half when she starting working on getting back on track. Trying to stay away from chocolate, pay attention to hunger and fullness cues, and eat more on schedule. Still having a lot of stress in her life, knee pain, and busy work schedule.   Surgery date: 04/16/12 Surgery type: LAGB Start weight @ [facility]: 396.5 lbs  Weight today:  337 lbs Weight change: 9.5 lbs GAIN Total weight lost:  59.5 lbs  Weight goal: 175-200 lbs % Weight goal met:  27-30%  TANITA  BODY COMP RESULTS  06/12/12 07/08/12 10/23/12 05/20/13 08/21/13   BMI  54.2 52.5 50.9 49.8 51.2   Fat Mass (lbs) 183.0 183.5 177.0 171.5 177.0   Fat Free Mass (lbs) 173.5 162.0 158.0 156.0 160.0   Total Body Water (lbs) 127.0 118.5 115.5 114.0 117.0   24-hr recall:  2 protein shakes per day, baked chicken, small hamburger (chopped), [place] links, eggs, vegetables (carrots, celery) with Laughing Cow swiss, green beans, cabbage, cracker, rice, and potatoes sometimes  Fluid intake:  64 oz+ water and protein shakes, and 8-12 oz coffee with cream and splenda Estimated total protein intake: 60g  Medications: No changes reported Supplementation:  Taking  Using straws: No Drinking while eating: No Hair loss:  "shedding" more  Carbonated beverages:  No N/V/D/C:  Some nausea continues at various times; inconsistent.   Last Lap-Band fill:  02/20/13 = 0.25 cc.  States, "my body is all over the place" - reports she can eat things she shouldn't, but stops herself. Cookie and piece of cheese cake once every other week or so. May have a small piece of chocolate instead. Still has difficulty with chicken breast and beef (roast beef, steak, hamburger, etc). Can do dark chicken meat.   Recent physical  activity:  None since 02/2013 d/t knee pain and 2nd job   Progress Towards Goal(s):  In progress.  Nutritional Diagnosis:  [place]-3.3 Overweight/obesity As related to recent LAGB surgery.  As evidenced by patient following post-op nutrition guidelines to meet weight loss goal of 175-200 lbs.    Intervention:  Nutrition education/reinforcement. Recommended doing the Pre-Op Diet for 2 weeks to help jump start her weight loss then move on to the Specialized Post-Op Diet (High Protein + NS Vegetables). Encouraged Eupha to exercise when she can, try to manage stress, and discuss strategies for eating on Thanksgiving.   Handouts given out:   Pre-Op Diet  Specialized Post-Op Diet   Holiday Eating and Recipes After Bariatric Surgery  Monitoring/Evaluation:  Dietary intake, exercise, lap band fills, and body weight. Follow up in 3 months for 20 month post-op visit.

## 2013-08-21 NOTE — Patient Instructions (Signed)
Goals:  Follow Pre-Op Diet for two weeks  Then, follow Specialized Post Op Diet - High Protein + Non-Starchy Vegetables  Increase lean protein foods to meet 60-80g goal  Make sure to have protein with ALL carbs  Increase fluid intake to 64oz +  Keep carbs to 15 g or less per meal and 10g or less per snack; always have protein with carbs.   Consider having Atkins protein bars or [facility] Protein bars to help with chocolate cravings  Aim for >30 min of physical activity daily as able per pain level

## 2013-08-28 ENCOUNTER — Encounter (INDEPENDENT_AMBULATORY_CARE_PROVIDER_SITE_OTHER): Payer: 59

## 2013-09-18 ENCOUNTER — Ambulatory Visit (INDEPENDENT_AMBULATORY_CARE_PROVIDER_SITE_OTHER): Payer: 59 | Admitting: Physician Assistant

## 2013-09-18 ENCOUNTER — Encounter (INDEPENDENT_AMBULATORY_CARE_PROVIDER_SITE_OTHER): Payer: Self-pay

## 2013-09-18 VITALS — BP 126/84 | HR 80 | Temp 97.4°F | Resp 16 | Ht 68.0 in | Wt 335.8 lb

## 2013-09-18 DIAGNOSIS — Z9884 Bariatric surgery status: Secondary | ICD-10-CM

## 2013-09-18 NOTE — Progress Notes (Signed)
  HISTORY: Jacqueline Morrison is a 47 y.o.female who received an AP-Standard lap-band in July 2013 by Dr. Daphine Deutscher. She comes in with about 4 lbs weight gain which she attributes to stress eating as well as eating sugary/high-carbohydrate foods. She has had some issues with palpitations and syncope for which she is seeing cardiology. She is currently on a Holter monitor. She describes small portion sizes and hunger which is under very good control. She occasionally awakens in the middle of the night with hunger but this is usually tempered with some water. If she overeats, she feels epigastric pressure but no desire to regurgitate. She has not been exercising as she had in the past.  VITAL SIGNS: Filed Vitals:   09/18/13 0925  BP: 126/84  Pulse: 80  Temp: 97.4 F (36.3 C)  Resp: 16    PHYSICAL EXAM: Physical exam reveals a very well-appearing 47 y.o.female in no apparent distress Neurologic: Awake, alert, oriented Psych: Bright affect, conversant Respiratory: Breathing even and unlabored. No stridor or wheezing Extremities: Atraumatic, good range of motion. Skin: Warm, Dry, no rashes Musculoskeletal: Normal gait, Joints normal  ASSESMENT: 47 y.o.  female  s/p AP-Standard lap-band.   PLAN: As she's in the green zone we deferred a fill today. She would like to wait until the cardiology issues are resolved before making any adjustments to the band, if necessary. We'll have her back in two months or sooner if needed.

## 2013-09-18 NOTE — Patient Instructions (Signed)
Return in February. Focus on good food choices as well as physical activity. Return sooner if you have an increase in hunger, portion sizes or weight. Return also for difficulty swallowing, night cough, reflux.

## 2013-11-20 ENCOUNTER — Encounter (INDEPENDENT_AMBULATORY_CARE_PROVIDER_SITE_OTHER): Payer: 59

## 2013-11-21 ENCOUNTER — Ambulatory Visit: Payer: 59 | Admitting: Dietician

## 2013-11-27 ENCOUNTER — Encounter (INDEPENDENT_AMBULATORY_CARE_PROVIDER_SITE_OTHER): Payer: 59

## 2015-01-30 ENCOUNTER — Emergency Department (INDEPENDENT_AMBULATORY_CARE_PROVIDER_SITE_OTHER): Payer: Worker's Compensation

## 2015-01-30 ENCOUNTER — Emergency Department (INDEPENDENT_AMBULATORY_CARE_PROVIDER_SITE_OTHER)
Admission: EM | Admit: 2015-01-30 | Discharge: 2015-01-30 | Disposition: A | Discharge status: Home / Self Care | Payer: Worker's Compensation | Source: Home / Self Care

## 2015-01-30 ENCOUNTER — Encounter (HOSPITAL_COMMUNITY): Payer: Self-pay | Admitting: Emergency Medicine

## 2015-01-30 DIAGNOSIS — S63601A Unspecified sprain of right thumb, initial encounter: Secondary | ICD-10-CM

## 2015-01-30 MED ORDER — IBUPROFEN 800 MG PO TABS
800.0000 mg | ORAL_TABLET | Freq: Once | ORAL | Status: AC
  Administered 2015-01-30: 800 mg via ORAL

## 2015-01-30 MED ORDER — HYDROCODONE-ACETAMINOPHEN 5-325 MG PO TABS: 1.0000 | ORAL_TABLET | ORAL | Status: DC | PRN

## 2015-01-30 MED ORDER — IBUPROFEN 800 MG PO TABS
ORAL_TABLET | ORAL | Status: AC
  Filled 2015-01-30: qty 1

## 2015-01-30 NOTE — ED Provider Notes (Signed)
CSN: [id number]     Arrival date & time 01/30/15  1309 History   None    Chief Complaint  Patient presents with  . Fall    W/C   (Consider location/radiation/quality/duration/timing/severity/associated sxs/prior Treatment)  HPI   Patient is a 49 year old female presenting today with complaints of right thumb pain after slipping and falling at work yesterday afternoon. Patient states that she is used ice and Tylenol for discomfort, but is concerned that she may have broken the thumb as it is swelling and the discomfort has increased in severity today.  Significant medical history includes hypertension, hyperlipidemia, and rheumatoid arthritis. Denies allergies to medications.  Past Medical History  Diagnosis Date  . Hyperlipidemia   . Hypertension   . Arthritis     KNEES, HIPS, ELBOWS, HANDS  . H/O hiatal hernia     SEEN ON XRAY-NO SYMPTOMS  . Complication of anesthesia     PT REMEMBERS WAKING UP WHILE INTUBATED WITH  UTERINE ABLATION IN 2006--STATES SHE WAS QUICKLY PUT BACK TO SLEEP--THAT WAS PT'S  LAST SURGERY--SURGERY WAS AT [facility] IN [place].   Past Surgical History  Procedure Laterality Date  . Appendectomy    . Tubal ligation    . Hysteroscopy  1998  . Dilation and curettage of uterus  1998  . Endometrial ablation  2006  . Laparoscopic gastric banding  04/16/2012    Procedure: LAPAROSCOPIC GASTRIC BANDING;  Surgeon: Pedro Earls, MD;  Location: [facility];  Service: General;  Laterality: N/A;  . Hiatal hernia repair  04/16/2012    Procedure: LAPAROSCOPIC REPAIR OF HIATAL HERNIA;  Surgeon: Pedro Earls, MD;  Location: [facility];  Service: General;;   Family History  Problem Relation Age of Onset  . Cancer Maternal Aunt     breast  . Cancer Maternal Grandfather     colon   History  Substance Use Topics  . Smoking status: Never Smoker   . Smokeless tobacco: Never Used  . Alcohol Use: 1.2 oz/week    2 Glasses of wine per week   OB History      No data available     Review of Systems  Constitutional: Negative.   HENT: Negative.        Bruised her lip in fall.  See image.  Denies jaw, mouth or tooth pain. No shifting of teeth.  Eyes: Negative.   Respiratory: Negative.   Cardiovascular: Negative.   Gastrointestinal: Negative.   Endocrine: Negative.   Genitourinary: Negative.   Musculoskeletal: Positive for joint swelling.       Swelling and tenderness of R thumb.    Skin: Positive for color change.       Bruising of inner lower lip.  Erythema and swelling of R thumb.   Allergic/Immunologic:       History significant for rheumatoid arthritis.  Neurological: Negative.  Negative for weakness and numbness.  Hematological: Negative.   Psychiatric/Behavioral: Negative.     Allergies  Review of patient's allergies indicates no known allergies.  Home Medications   Prior to Admission medications   Medication Sig Start Date End Date Taking? Authorizing Provider  CRESTOR 40 MG tablet Take 40 mg by mouth daily.  09/28/11  Yes Historical Provider, MD  ramipril (ALTACE) 10 MG capsule Take 10 mg by mouth daily.  09/28/11  Yes Historical Provider, MD  acetaminophen (TYLENOL) 500 MG tablet Take 500 mg by mouth every 6 (six) hours as needed. pain    Historical Provider,  MD  calcium carbonate, dosed in mg elemental calcium, 1250 MG/5ML Take 500 mg of elemental calcium by mouth.    Historical Provider, MD  HYDROcodone-acetaminophen (NORCO/VICODIN) 5-325 MG per tablet Take 1-2 tablets by mouth every 4 (four) hours as needed. 01/30/15   Nehemiah Settle, NP  Multiple Vitamin (MULTIVITAMIN) capsule Take 1 capsule by mouth daily.     Historical Provider, MD   BP 146/71 mmHg  Pulse 69  Temp(Src) 98.1 F (36.7 C) (Oral)  Resp 12  SpO2 100%   Physical Exam  Constitutional: She appears well-developed and well-nourished. No distress.  Cardiovascular: Normal rate, regular rhythm, normal heart sounds and intact distal pulses.  Exam reveals  no gallop and no friction rub.   No murmur heard. Pulmonary/Chest: Effort normal and breath sounds normal. No respiratory distress. She has no wheezes. She has no rales. She exhibits no tenderness.  Musculoskeletal: Normal range of motion. She exhibits edema and tenderness.       Right hand: She exhibits tenderness, bony tenderness and swelling. She exhibits normal capillary refill, no deformity and no laceration. Normal sensation noted. Normal strength noted. She exhibits no finger abduction, no thumb/finger opposition and no wrist extension trouble.  See images.  Skin: She is not diaphoretic.  Nursing note and vitals reviewed.            ED Course  Procedures (including critical care time) Labs Review Labs Reviewed - No data to display  Imaging Review Dg Hand Complete Right  01/30/2015   CLINICAL DATA:  Fall 1 day ago.  Pain in the thumb proximal phalanx.  EXAM: RIGHT HAND - COMPLETE 3+ VIEW  COMPARISON:  None.  FINDINGS: Negative for fracture or dislocation. Soft tissues are unremarkable. Alignment of the hand is normal.  IMPRESSION: No acute bone abnormality.   Electronically Signed   By: Markus Daft M.D.   On: 01/30/2015 15:05   Plan of care was discussed with Dr. Georgina Snell.  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    MDM   1. Thumb sprain, right, initial encounter    Meds ordered this encounter  Medications  . ibuprofen (ADVIL,MOTRIN) tablet 800 mg    Sig:   . HYDROcodone-acetaminophen (NORCO/VICODIN) 5-325 MG per tablet    Sig: Take 1-2 tablets by mouth every 4 (four) hours as needed.    Dispense:  15 tablet    Refill:  0   The patient verbalizes understanding and agrees to plan of care.       Nehemiah Settle, NP 01/30/15 1537

## 2015-01-30 NOTE — ED Notes (Signed)
Reports falling at work and breaking fall with right hand injuring right thumb.  Having pain, swelling, and limited ROM.  Also  Injury to left lower lip.   Mild relief with using ice and tylenol.  Incident happened yesterday evening while at work.

## 2015-01-30 NOTE — Discharge Instructions (Signed)
Finger Sprain  A finger sprain is a tear in one of the strong, fibrous tissues that connect the bones (ligaments) in your finger. The severity of the sprain depends on how much of the ligament is torn. The tear can be either partial or complete.  CAUSES   Often, sprains are a result of a fall or accident. If you extend your hands to catch an object or to protect yourself, the force of the impact causes the fibers of your ligament to stretch too much. This excess tension causes the fibers of your ligament to tear.  SYMPTOMS   You may have some loss of motion in your finger. Other symptoms include:   Bruising.   Tenderness.   Swelling.  DIAGNOSIS   In order to diagnose finger sprain, your caregiver will physically examine your finger or thumb to determine how torn the ligament is. Your caregiver may also suggest an X-ray exam of your finger to make sure no bones are broken.  TREATMENT   If your ligament is only partially torn, treatment usually involves keeping the finger in a fixed position (immobilization) for a short period. To do this, your caregiver will apply a bandage, cast, or splint to keep your finger from moving until it heals. For a partially torn ligament, the healing process usually takes 2 to 3 weeks.  If your ligament is completely torn, you may need surgery to reconnect the ligament to the bone. After surgery a cast or splint will be applied and will need to stay on your finger or thumb for 4 to 6 weeks while your ligament heals.  HOME CARE INSTRUCTIONS   Keep your injured finger elevated, when possible, to decrease swelling.   To ease pain and swelling, apply ice to your joint twice a day, for 2 to 3 days:   Put ice in a plastic bag.   Place a towel between your skin and the bag.   Leave the ice on for 15 minutes.   Only take over-the-counter or prescription medicine for pain as directed by your caregiver.   Do not wear rings on your injured finger.   Do not leave your finger unprotected  until pain and stiffness go away (usually 3 to 4 weeks).   Do not allow your cast or splint to get wet. Cover your cast or splint with a plastic bag when you shower or bathe. Do not swim.   Your caregiver may suggest special exercises for you to do during your recovery to prevent or limit permanent stiffness.  SEEK IMMEDIATE MEDICAL CARE IF:   Your cast or splint becomes damaged.   Your pain becomes worse rather than better.  MAKE SURE YOU:   Understand these instructions.   Will watch your condition.   Will get help right away if you are not doing well or get worse.  Document Released: 11/02/2004 Document Revised: 12/18/2011 Document Reviewed: 05/29/2011  ExitCare Patient Information 2015 [employer]. This information is not intended to replace advice given to you by your health care provider. Make sure you discuss any questions you have with your health care provider.

## 2016-11-14 ENCOUNTER — Encounter (HOSPITAL_COMMUNITY): Payer: Self-pay

## 2020-01-16 ENCOUNTER — Ambulatory Visit (INDEPENDENT_AMBULATORY_CARE_PROVIDER_SITE_OTHER): Payer: 59 | Admitting: Nurse Practitioner

## 2020-01-16 ENCOUNTER — Other Ambulatory Visit: Payer: Self-pay

## 2020-01-16 VITALS — BP 130/64 | HR 62 | Temp 97.3°F | Ht 67.0 in | Wt 344.8 lb

## 2020-01-16 DIAGNOSIS — R062 Wheezing: Secondary | ICD-10-CM | POA: Diagnosis not present

## 2020-01-16 DIAGNOSIS — Z8616 Personal history of COVID-19: Secondary | ICD-10-CM | POA: Diagnosis not present

## 2020-01-16 DIAGNOSIS — R519 Headache, unspecified: Secondary | ICD-10-CM | POA: Diagnosis not present

## 2020-01-16 DIAGNOSIS — R0602 Shortness of breath: Secondary | ICD-10-CM | POA: Diagnosis not present

## 2020-01-16 MED ORDER — ALBUTEROL SULFATE HFA 108 (90 BASE) MCG/ACT IN AERS
2.0000 | INHALATION_SPRAY | Freq: Four times a day (QID) | RESPIRATORY_TRACT | 0 refills | Status: AC | PRN
Start: 2020-01-16 — End: ?

## 2020-01-16 MED ORDER — ALBUTEROL SULFATE (2.5 MG/3ML) 0.083% IN NEBU: 2.5000 mg | INHALATION_SOLUTION | Freq: Four times a day (QID) | RESPIRATORY_TRACT | 1 refills | Status: AC | PRN

## 2020-01-16 NOTE — Assessment & Plan Note (Signed)
Assessment: Patient was diagnosed with Covid in November 2020. She continues to have shortness of breath, fatigue, and headaches. Patient walked in office today O2 sats dropped to 85% on RA - patient was placed on O2 at 2L and O2 sats returned to normal.   Plan:  Shortness of Breath:  Patient walked in office today O2 sats dropped to 85% on RA - patient was placed on O2 at 2L and O2 sats returned to normal  Please keep close check on O2 at home. May use 2L O2 with exertion and RA at rest - keep O2 Sats above 90%  Will order albuterol to use every 6 hours as needed  Will place referral to pulmonary - will need pulmonary rehab, PFT, overnight pulse oximetry, and sleep study.  Will order chest x ray and call with results  Hand out given on nutritional rehabilitation  Follow up in 3 weeks or sooner if needed

## 2020-01-16 NOTE — Patient Instructions (Signed)
Shortness of Breath:  Patient walked in office today O2 sats dropped to 85% on RA - patient was placed on O2 at 2L and O2 sats returned to normal  Please keep close check on O2 at home. May use 2L O2 with exertion and RA at rest - keep O2 Sats above 90%  Will order albuterol to use every 6 hours as needed  Will place referral to pulmonary - will need pulmonary rehab, PFT, overnight pulse oximetry, and sleep study.  Will order chest x ray and call with results   Hand out given on nutritional rehabilitation  Follow up in 3 weeks or sooner if needed

## 2020-01-16 NOTE — Progress Notes (Signed)
@Patient  ID: Jacqueline Morrison, female    DOB: 08/14/1966, 54 y.o.   MRN: [id number]  Chief Complaint  Patient presents with  . Post COVID    Postive 08/2019. Is currently expieriencing multiple sx such [id number], dizziness, fatigue, weakness, burning sensation in lungs. Wheezing at night started a few weeks ago.     Referring provider: Laurel Dimmer, MD   54 year old female with history of hypertension, hyperlipidemia, and obesity. Diagnosed with Covid 11/21.   HPI  Patient presents today for a post covid care clinic visit. She was diagnosed with Covid in November and states at that time she experienced moderate to severe symptoms, but did not go to the hospital. Her PCP helped her at that time through tele-visits and prescribed doxycycline and a couple rounds of prednisone. She states that she is still continuing to experience shortness of breath with exertion, wheezing, fatigue, and headaches. She was also experiencing heart palpitations, but has recently seen cardiology for evaluation and management. She has continued to work this entire time, but states that she is able to sit down and take breaks as needed. She denies any cough at this time. She recently had labs through PCP and all were normal. Her last chest x ray was in December and it was clear at that time. Denies f/c/s, n/v/d, hemoptysis, PND, chest pain or edema.     Note: Patient walked in office today O2 sats dropped to 85% on RA - patient was placed on O2 at 2L and O2 sats returned to normal    No Known Allergies   There is no immunization history on file for this patient.  Past Medical History:  Diagnosis Date  . Arthritis    KNEES, HIPS, ELBOWS, HANDS  . Complication of anesthesia    PT REMEMBERS WAKING UP WHILE INTUBATED WITH  UTERINE ABLATION IN 2006--STATES SHE WAS QUICKLY PUT BACK TO SLEEP--THAT WAS PT'S  LAST SURGERY--SURGERY WAS AT [facility] IN [place].  Marland Kitchen H/O hiatal hernia    SEEN ON XRAY-NO SYMPTOMS  . Hyperlipidemia   . Hypertension     Tobacco History: Social History   Tobacco Use  Smoking Status Never Smoker  Smokeless Tobacco Never Used   Counseling given: Yes   Outpatient Encounter Medications as of 01/16/2020  Medication Sig  . acetaminophen (TYLENOL) 500 MG tablet Take 500 mg by mouth every 6 (six) hours as needed. pain  . calcium carbonate, dosed in mg elemental calcium, 1250 MG/5ML Take 500 mg of elemental calcium by mouth.  . methocarbamol (ROBAXIN) 500 MG tablet Take by mouth.  . Multiple Vitamin (MULTIVITAMIN) capsule Take 1 capsule by mouth daily.   . rosuvastatin (CRESTOR) 20 MG tablet TAKE 1 TABLET(20 MG) BY MOUTH EVERY EVENING  . albuterol (PROVENTIL) (2.5 MG/3ML) 0.083% nebulizer solution Take 3 mLs (2.5 mg total) by nebulization every 6 (six) hours as needed for wheezing or shortness of breath.  Marland Kitchen albuterol (VENTOLIN HFA) 108 (90 Base) MCG/ACT inhaler Inhale 2 puffs into the lungs every 6 (six) hours as needed for wheezing or shortness of breath.  . CRESTOR 40 MG tablet Take 40 mg by mouth daily.   Marland Kitchen HYDROcodone-acetaminophen (NORCO/VICODIN) 5-325 MG per tablet Take 1-2 tablets by mouth every 4 (four) hours as needed. (Patient not taking: Reported on 01/16/2020)  . metoprolol succinate (TOPROL-XL) 25 MG 24 hr tablet Take 25 mg by mouth daily.  . Potassium Chloride ER 20 MEQ TBCR Take 1 tablet by mouth  daily.  . ramipril (ALTACE) 10 MG capsule Take 10 mg by mouth daily.   Marland Kitchen topiramate (TOPAMAX) 25 MG tablet    No facility-administered encounter medications on file as of 01/16/2020.     Review of Systems  Review of Systems  Constitutional: Positive for activity change (decreased) and fatigue.  HENT: Negative.   Respiratory: Positive for shortness of breath and wheezing. Negative for cough.   Cardiovascular: Positive for palpitations. Negative for chest pain and leg swelling.  Gastrointestinal: Negative.   Allergic/Immunologic:  Negative.   Neurological: Negative.   Psychiatric/Behavioral: Negative.        Physical Exam  BP 130/64 (BP Location: Left Arm, Patient Position: Sitting, Cuff Size: Large)   Pulse 62   Temp (!) 97.3 F (36.3 C)   Ht 5\' 7"  (1.702 m)   Wt (!) 344 lb 12 oz (156.4 kg)   SpO2 99%   BMI 54.00 kg/m   Wt Readings from Last 5 Encounters:  01/16/20 (!) 344 lb 12 oz (156.4 kg)  09/18/13 (!) 335 lb 12.8 oz (152.3 kg)  08/21/13 (!) 337 lb (152.9 kg)  05/29/13 (!) 331 lb 12.8 oz (150.5 kg)  05/20/13 (!) 327 lb 8 oz (148.6 kg)     Physical Exam Vitals and nursing note reviewed.  Constitutional:      General: She is not in acute distress.    Appearance: She is well-developed.  Cardiovascular:     Rate and Rhythm: Normal rate. Rhythm irregular.     Comments: Followed by cardiology Pulmonary:     Effort: Pulmonary effort is normal. No respiratory distress.     Breath sounds: Normal breath sounds. No wheezing or rhonchi.  Musculoskeletal:     Right lower leg: No edema.     Left lower leg: No edema.  Neurological:     Mental Status: She is alert and oriented to person, place, and time.  Psychiatric:        Mood and Affect: Mood normal.        Behavior: Behavior normal.       Assessment & Plan:   History of COVID-19 Assessment: Patient was diagnosed with Covid in November 2020. She continues to have shortness of breath, fatigue, and headaches. Patient walked in office today O2 sats dropped to 85% on RA - patient was placed on O2 at 2L and O2 sats returned to normal.   Plan:  Shortness of Breath:  Patient walked in office today O2 sats dropped to 85% on RA - patient was placed on O2 at 2L and O2 sats returned to normal  Please keep close check on O2 at home. May use 2L O2 with exertion and RA at rest - keep O2 Sats above 90%  Will order albuterol to use every 6 hours as needed  Will place referral to pulmonary - will need pulmonary rehab, PFT, overnight pulse oximetry,  and sleep study.  Will order chest x ray and call with results  Hand out given on nutritional rehabilitation  Follow up in 3 weeks or sooner if needed           Fenton Foy, NP 01/16/2020

## 2020-01-19 ENCOUNTER — Ambulatory Visit (HOSPITAL_COMMUNITY)
Admission: RE | Admit: 2020-01-19 | Discharge: 2020-01-19 | Disposition: A | Discharge status: Home / Self Care | Payer: 59 | Source: Ambulatory Visit | Attending: Nurse Practitioner | Admitting: Nurse Practitioner

## 2020-01-19 ENCOUNTER — Other Ambulatory Visit: Payer: Self-pay

## 2020-01-19 DIAGNOSIS — Z09 Encounter for follow-up examination after completed treatment for conditions other than malignant neoplasm: Secondary | ICD-10-CM | POA: Insufficient documentation

## 2020-01-19 DIAGNOSIS — Z8616 Personal history of COVID-19: Secondary | ICD-10-CM | POA: Diagnosis not present

## 2020-01-19 NOTE — Progress Notes (Signed)
Patient notified of results, verbally understood. No additional questions.

## 2020-01-21 ENCOUNTER — Other Ambulatory Visit: Payer: Self-pay | Admitting: Nurse Practitioner

## 2020-01-21 NOTE — Progress Notes (Signed)
Created in error

## 2020-02-05 ENCOUNTER — Other Ambulatory Visit: Payer: Self-pay

## 2020-02-05 ENCOUNTER — Ambulatory Visit: Payer: 59 | Admitting: Emergency Medicine

## 2020-02-05 ENCOUNTER — Encounter: Payer: Self-pay | Admitting: Emergency Medicine

## 2020-02-05 VITALS — BP 132/80 | HR 72 | Temp 98.4°F | Ht 67.0 in | Wt 334.4 lb

## 2020-02-05 DIAGNOSIS — R0683 Snoring: Secondary | ICD-10-CM

## 2020-02-05 DIAGNOSIS — R0602 Shortness of breath: Secondary | ICD-10-CM | POA: Diagnosis not present

## 2020-02-05 NOTE — Progress Notes (Signed)
Subjective:    Patient ID: Jacqueline Morrison, female    DOB: 10-Apr-1966, 54 y.o.   MRN: [id number]  HPI 54 year old obese woman, never smoker with a history of hypertension, hyperlipidemia, hiatal hernia, arthritis.  She has a history of LAP-BAND surgery 2015.  She is referred for dyspnea, cough, newly identified exertional hypoxemia.  She had COVID-19 pneumonia/pneumonitis in 08/2019.  She has continued to have exertional shortness of breath, fatigue, headaches in the aftermath of this.  Her exercise tolerance is decreased and she has to sit down and take breaks with exertion such as walking or working. Worse over the last 3 months. Difficulty with talking.  She was also started empirically on albuterol 01/16/2020, she reports that this may help her some. She was given O2 to use w exertion, has been using prn until she can get better portable tanks. She has lost 25 lbs since last November. She snores occasionally.   A chest x-ray done 01/19/2020 is reviewed by me, shows no evidence of interstitial change, no infiltrates.   Review of Systems As per HPI   Past Medical History:  Diagnosis Date  . Arthritis    KNEES, HIPS, ELBOWS, HANDS  . Complication of anesthesia    PT REMEMBERS WAKING UP WHILE INTUBATED WITH  UTERINE ABLATION IN 2006--STATES SHE WAS QUICKLY PUT BACK TO SLEEP--THAT WAS PT'S  LAST SURGERY--SURGERY WAS AT [facility] IN [place].  Marland Kitchen H/O hiatal hernia    SEEN ON XRAY-NO SYMPTOMS  . Hyperlipidemia   . Hypertension      Family History  Problem Relation Age of Onset  . Cancer Maternal Aunt        breast  . Cancer Maternal Grandfather        colon     Social History   Socioeconomic History  . Marital status: Divorced    Spouse name: Not on file  . Number of children: Not on file  . Years of education: Not on file  . Highest education level: Not on file  Occupational History  . Not on file  Tobacco Use  . Smoking status: Never Smoker  .  Smokeless tobacco: Never Used  Substance and Sexual Activity  . Alcohol use: Yes    Alcohol/week: 2.0 standard drinks    Types: 2 Glasses of wine per week  . Drug use: No  . Sexual activity: Not on file  Other Topics Concern  . Not on file  Social History Narrative  . Not on file   Social Determinants of Health   Financial Resource Strain:   . Difficulty of Paying Living Expenses:   Food Insecurity:   . Worried About Charity fundraiser in the Last Year:   . Arboriculturist in the Last Year:   Transportation Needs:   . Film/video editor (Medical):   Marland Kitchen Lack of Transportation (Non-Medical):   Physical Activity:   . Days of Exercise per Week:   . Minutes of Exercise per Session:   Stress:   . Feeling of Stress :   Social Connections:   . Frequency of Communication with Friends and Family:   . Frequency of Social Gatherings with Friends and Family:   . Attends Religious Services:   . Active Member of Clubs or Organizations:   . Attends Archivist Meetings:   Marland Kitchen Marital Status:   Intimate Partner Violence:   . Fear of Current or Ex-Partner:   . Emotionally Abused:   .  Physically Abused:   . Sexually Abused:    Works as a Midwife at [employer].   No Known Allergies   Outpatient Medications Prior to Visit  Medication Sig Dispense Refill  . acetaminophen (TYLENOL) 500 MG tablet Take 500 mg by mouth every 6 (six) hours as needed. pain    . albuterol (PROVENTIL) (2.5 MG/3ML) 0.083% nebulizer solution Take 3 mLs (2.5 mg total) by nebulization every 6 (six) hours as needed for wheezing or shortness of breath. 150 mL 1  . albuterol (VENTOLIN HFA) 108 (90 Base) MCG/ACT inhaler Inhale 2 puffs into the lungs every 6 (six) hours as needed for wheezing or shortness of breath. 6.7 g 0  . calcium carbonate, dosed in mg elemental calcium, 1250 MG/5ML Take 500 mg of elemental calcium by mouth.    . chlorthalidone (HYGROTON) 25 MG tablet TAKE 1 TABLET BY MOUTH  EVERY MORNING    . CRESTOR 40 MG tablet Take 40 mg by mouth daily.     . methocarbamol (ROBAXIN) 500 MG tablet Take by mouth.    . metoprolol succinate (TOPROL-XL) 25 MG 24 hr tablet Take 25 mg by mouth daily.    . Multiple Vitamin (MULTIVITAMIN) capsule Take 1 capsule by mouth daily.     . Potassium Chloride ER 20 MEQ TBCR Take 1 tablet by mouth daily.    . rosuvastatin (CRESTOR) 20 MG tablet TAKE 1 TABLET(20 MG) BY MOUTH EVERY EVENING    . topiramate (TOPAMAX) 25 MG tablet     . HYDROcodone-acetaminophen (NORCO/VICODIN) 5-325 MG per tablet Take 1-2 tablets by mouth every 4 (four) hours as needed. 15 tablet 0  . ramipril (ALTACE) 10 MG capsule Take 10 mg by mouth daily.      No facility-administered medications prior to visit.        Objective:   Physical Exam Today's Vitals   02/05/20 1140  BP: 132/80  Pulse: 72  Temp: 98.4 F (36.9 C)  TempSrc: Temporal  SpO2: 98%  Weight: (!) 334 lb 6.4 oz (151.7 kg)  Height: 5\' 7"  (1.702 m)   Body mass index is 52.37 kg/m.  Gen: Pleasant, obese woman, in no distress,  normal affect  ENT: No lesions,  mouth clear,  oropharynx clear, no postnasal drip  Neck: No JVD, no stridor  Lungs: No use of accessory muscles, no crackles or wheezing on normal respiration, no wheeze on forced expiration  Cardiovascular: RRR, heart sounds normal, no murmur or gallops, no peripheral edema  Musculoskeletal: No deformities, no cyanosis or clubbing  Neuro: alert, awake, non focal  Skin: Warm, no lesions or rash     Assessment & Plan:  Shortness of breath Multifactorial.  Certainly deconditioning, obesity, restrictive lung disease contributing, but consider possible contribution of secondary pulmonary hypertension in the setting of undiagnosed OSA/OHS.  Consider also post COVID-19 illness.  She does not have any evidence for interstitial disease or pneumonitis on chest x-ray but may have evolved obstructive lung disease following Covid.  She was  found to be hypoxemic with exertion and started on supplemental oxygen.  We will confirm this today, try to get her a more portable system (POC) by titrating to pulsed flow.  We will arrange for full pulmonary function testing Repeat walking oximetry today.  If your oxygen level remains low then we will work on obtaining a portable oxygen concentrator We will arrange for a split-night sleep study Keep albuterol available to use 2 puffs if needed for shortness of breath, chest  tightness, wheezing. Follow with Dr Lamonte Sakai next available after your sleep study and PFT are completed to review together.  Baltazar Apo, MD, PhD 02/05/2020, 5:21 PM [facility] [phone] or if no answer [phone]

## 2020-02-05 NOTE — Patient Instructions (Signed)
We will arrange for full pulmonary function testing Repeat walking oximetry today.  If your oxygen level remains low then we will work on obtaining a portable oxygen concentrator We will arrange for a split-night sleep study Keep albuterol available to use 2 puffs if needed for shortness of breath, chest tightness, wheezing. Follow with Dr Lamonte Sakai next available after your sleep study and PFT are completed to review together.

## 2020-02-05 NOTE — Assessment & Plan Note (Addendum)
Multifactorial.  Certainly deconditioning, obesity, restrictive lung disease contributing, but consider possible contribution of secondary pulmonary hypertension in the setting of undiagnosed OSA/OHS.  Consider also post COVID-19 illness.  She does not have any evidence for interstitial disease or pneumonitis on chest x-ray but may have evolved obstructive lung disease following Covid.  She was found to be hypoxemic with exertion and started on supplemental oxygen.  We will confirm this today, try to get her a more portable system (POC) by titrating to pulsed flow.  We will arrange for full pulmonary function testing Repeat walking oximetry today.  If your oxygen level remains low then we will work on obtaining a portable oxygen concentrator We will arrange for a split-night sleep study Keep albuterol available to use 2 puffs if needed for shortness of breath, chest tightness, wheezing. Follow with Dr Lamonte Sakai next available after your sleep study and PFT are completed to review together.

## 2020-02-09 ENCOUNTER — Other Ambulatory Visit: Payer: Self-pay

## 2020-02-09 ENCOUNTER — Ambulatory Visit (INDEPENDENT_AMBULATORY_CARE_PROVIDER_SITE_OTHER): Payer: 59 | Admitting: Nurse Practitioner

## 2020-02-09 DIAGNOSIS — Z8616 Personal history of COVID-19: Secondary | ICD-10-CM | POA: Diagnosis not present

## 2020-02-09 NOTE — Progress Notes (Signed)
@Patient  ID: Jacqueline Morrison, female    DOB: 1966/04/24, 54 y.o.   MRN: [id number]  Chief Complaint  Patient presents with  . Follow-up    Referring provider: Laurel Dimmer, MD   54 year old female with history of hypertension, hyperlipidemia, and obesity. Diagnosed with Covid 11/21.  HPI   Patient presents today for post covid care visit follow up. Since her last visit patient has had initial visit with pulmonary. She is scheduled for a PFT and a sleep study. She has continued on her inhaler and has used O2 as needed. Patient is also followed by cardiology. She still complains of shortness of breath with exertion and fatigue. Patient states that she is not eating well. At last visit she was given a nutritional rehabilitation hand out. 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 She is able to take breaks or leave if she becomes severely fatigued. Denies f/c/s, n/v/d, hemoptysis, PND, chest pain or edema.        No Known Allergies   There is no immunization history on file for this patient.  Past Medical History:  Diagnosis Date  . Arthritis    KNEES, HIPS, ELBOWS, HANDS  . Complication of anesthesia    PT REMEMBERS WAKING UP WHILE INTUBATED WITH  UTERINE ABLATION IN 2006--STATES SHE WAS QUICKLY PUT BACK TO SLEEP--THAT WAS PT'S  LAST SURGERY--SURGERY WAS AT [facility] IN [place].  Marland Kitchen H/O hiatal hernia    SEEN ON XRAY-NO SYMPTOMS  . Hyperlipidemia   . Hypertension     Tobacco History: Social History   Tobacco Use  Smoking Status Never Smoker  Smokeless Tobacco Never Used   Counseling given: Not Answered   Outpatient Encounter Medications as of 02/09/2020  Medication Sig  . acetaminophen (TYLENOL) 500 MG tablet Take 500 mg by mouth every 6 (six) hours as needed. pain  . albuterol (PROVENTIL) (2.5 MG/3ML) 0.083% nebulizer solution Take 3 mLs (2.5 mg total) by nebulization every 6 (six) hours as needed for wheezing  or shortness of breath.  Marland Kitchen albuterol (VENTOLIN HFA) 108 (90 Base) MCG/ACT inhaler Inhale 2 puffs into the lungs every 6 (six) hours as needed for wheezing or shortness of breath.  . calcium carbonate, dosed in mg elemental calcium, 1250 MG/5ML Take 500 mg of elemental calcium by mouth.  . chlorthalidone (HYGROTON) 25 MG tablet TAKE 1 TABLET BY MOUTH EVERY MORNING  . CRESTOR 40 MG tablet Take 40 mg by mouth daily.   . methocarbamol (ROBAXIN) 500 MG tablet Take by mouth.  . metoprolol succinate (TOPROL-XL) 25 MG 24 hr tablet Take 25 mg by mouth daily.  . Multiple Vitamin (MULTIVITAMIN) capsule Take 1 capsule by mouth daily.   . Potassium Chloride ER 20 MEQ TBCR Take 1 tablet by mouth daily.  . rosuvastatin (CRESTOR) 20 MG tablet TAKE 1 TABLET(20 MG) BY MOUTH EVERY EVENING  . topiramate (TOPAMAX) 25 MG tablet    No facility-administered encounter medications on file as of 02/09/2020.     Review of Systems  Review of Systems  Constitutional: Positive for fatigue. Negative for fever.  HENT: Negative.   Respiratory: Positive for shortness of breath. Negative for cough.   Cardiovascular: Negative.  Negative for chest pain, palpitations and leg swelling.  Gastrointestinal: Negative.   Allergic/Immunologic: Negative.   Neurological: Negative.   Psychiatric/Behavioral: Negative.        Physical Exam  BP 138/86 (BP Location: Left Arm, Patient Position: Sitting, Cuff  Size: Large)   Temp (!) 97.3 F (36.3 C) (Tympanic)   Wt Readings from Last 5 Encounters:  02/05/20 (!) 334 lb 6.4 oz (151.7 kg)  01/16/20 (!) 344 lb 12 oz (156.4 kg)  09/18/13 (!) 335 lb 12.8 oz (152.3 kg)  08/21/13 (!) 337 lb (152.9 kg)  05/29/13 (!) 331 lb 12.8 oz (150.5 kg)     Physical Exam Vitals and nursing note reviewed.  Constitutional:      General: She is not in acute distress.    Appearance: She is well-developed.  Cardiovascular:     Rate and Rhythm: Normal rate and regular rhythm.  Pulmonary:      Effort: Pulmonary effort is normal. No respiratory distress.     Breath sounds: Normal breath sounds. No wheezing or rhonchi.  Musculoskeletal:     Right lower leg: No edema.     Left lower leg: No edema.  Neurological:     Mental Status: She is alert and oriented to person, place, and time.  Psychiatric:        Mood and Affect: Mood normal.        Behavior: Behavior normal.       Imaging: DG Chest 2 View  Result Date: 01/19/2020 CLINICAL DATA:  History of COVID this past November with persistent shortness of breath. EXAM: CHEST - 2 VIEW COMPARISON:  09/17/2019 FINDINGS: Grossly unchanged cardiac silhouette and mediastinal contours. No focal parenchymal opacities. No pleural effusion or pneumothorax. No evidence of edema. Gastric lap banding device overlies the upper abdomen, incompletely evaluated. No acute osseous abnormalities. Stigmata of dish within the lower thoracic spine. IMPRESSION: No acute cardiopulmonary disease specifically, no radiographic evidence of COVID related fibrosis/scarring. Electronically Signed   By: Sandi Mariscal M.D.   On: 01/19/2020 07:42     Assessment & Plan:   History of COVID-19 Shortness of Breath:  Continue O2 if needed to keep sats above 90%  Please keep close check on O2 at home. May use 2L O2 with exertion and RA at rest - keep O2 Sats above 90%  Continue albuterol to use every 6 hours as needed  Awaiting PFT, overnight pulse oximetry, and sleep study.  Stay as active as possible   Follow up as needed      Fenton Foy, NP 02/09/2020

## 2020-02-09 NOTE — Assessment & Plan Note (Signed)
Shortness of Breath:  Continue O2 if needed to keep sats above 90%  Please keep close check on O2 at home. May use 2L O2 with exertion and RA at rest - keep O2 Sats above 90%  Continue albuterol to use every 6 hours as needed  Awaiting PFT, overnight pulse oximetry, and sleep study.  Stay as active as possible   Follow up as needed

## 2020-02-09 NOTE — Patient Instructions (Signed)
Shortness of Breath:  Continue O2 if needed to keep sats above 90%  Please keep close check on O2 at home. May use 2L O2 with exertion and RA at rest - keep O2 Sats above 90%  Continue albuterol to use every 6 hours as needed  Awaiting PFT, overnight pulse oximetry, and sleep study.  Stay as active as possible   Follow up as needed

## 2020-02-27 ENCOUNTER — Other Ambulatory Visit (HOSPITAL_COMMUNITY)
Admission: RE | Admit: 2020-02-27 | Discharge: 2020-02-27 | Disposition: A | Discharge status: Home / Self Care | Payer: 59 | Source: Ambulatory Visit | Attending: Pulmonary Disease | Admitting: Pulmonary Disease

## 2020-02-27 DIAGNOSIS — Z01812 Encounter for preprocedural laboratory examination: Secondary | ICD-10-CM | POA: Insufficient documentation

## 2020-02-27 DIAGNOSIS — Z20822 Contact with and (suspected) exposure to covid-19: Secondary | ICD-10-CM | POA: Insufficient documentation

## 2020-02-27 LAB — SARS CORONAVIRUS 2 (TAT 6-24 HRS): SARS Coronavirus 2: NEGATIVE

## 2020-02-29 ENCOUNTER — Other Ambulatory Visit: Payer: Self-pay

## 2020-02-29 ENCOUNTER — Ambulatory Visit (HOSPITAL_BASED_OUTPATIENT_CLINIC_OR_DEPARTMENT_OTHER): Payer: 59 | Attending: Emergency Medicine | Admitting: Pulmonary Disease

## 2020-02-29 DIAGNOSIS — R0683 Snoring: Secondary | ICD-10-CM | POA: Diagnosis not present

## 2020-03-10 DIAGNOSIS — G4733 Obstructive sleep apnea (adult) (pediatric): Secondary | ICD-10-CM | POA: Diagnosis not present

## 2020-03-10 NOTE — Procedures (Signed)
Patient Name: Jacqueline Morrison, Jacqueline Morrison Date: 02/29/2020 Gender: Female D.O.B: 05-24-1966 Age (years): 53 Referring Provider: Baltazar Apo Height (inches): 68 Interpreting Physician: Kara Mead MD, ABSM Weight (lbs): 336 RPSGT: Zadie Rhine BMI: 51 MRN: [id number] Neck Size: 13.50 <br> <br> CLINICAL INFORMATION Sleep Study Type: NPSG    Indication for sleep study: Snoring    Epworth Sleepiness Score: 4    SLEEP STUDY TECHNIQUE As per the AASM Manual for the Scoring of Sleep and Associated Events v2.3 (April 2016) with a hypopnea requiring 4% desaturations.  The channels recorded and monitored were frontal, central and occipital EEG, electrooculogram (EOG), submentalis EMG (chin), nasal and oral airflow, thoracic and abdominal wall motion, anterior tibialis EMG, snore microphone, electrocardiogram, and pulse oximetry.  MEDICATIONS Medications self-administered by patient taken the night of the study : N/A  SLEEP ARCHITECTURE The study was initiated at 10:00:32 PM and ended at 4:48:38 AM.  Sleep onset time was 42.9 minutes and the sleep efficiency was 61.4%%. The total sleep time was 250.5 minutes.  Stage REM latency was 102.5 minutes.  The patient spent 8.0%% of the night in stage N1 sleep, 84.4%% in stage N2 sleep, 0.2%% in stage N3 and 7.4% in REM.  Alpha intrusion was absent.  Supine sleep was 0.60%.  RESPIRATORY PARAMETERS The overall apnea/hypopnea index (AHI) was 0.2 per hour. There were 0 total apneas, including 0 obstructive, 0 central and 0 mixed apneas. There were 1 hypopneas and 0 RERAs.  The AHI during Stage REM sleep was 3.2 per hour.  AHI while supine was 0.0 per hour.  The mean oxygen saturation was 97.8%. The minimum SpO2 during sleep was 92.0%.  soft snoring was noted during this study.  CARDIAC DATA The 2 lead EKG demonstrated sinus rhythm. The mean heart rate was 73.9 beats per minute. Other EKG findings include: None.   LEG MOVEMENT DATA The  total PLMS were 0 with a resulting PLMS index of 0.0. Associated arousal with leg movement index was 0.0 .  IMPRESSIONS - No significant obstructive sleep apnea occurred during this study (AHI = 0.2/h). - No significant central sleep apnea occurred during this study (CAI = 0.0/h). - The patient had minimal or no oxygen desaturation during the study (Min O2 = 92.0%) - The patient snored with soft snoring volume. - No cardiac abnormalities were noted during this study. - Clinically significant periodic limb movements did not occur during sleep. No significant associated arousals.   DIAGNOSIS - Mild Nocturnal Hypoxemia (327.26 [G47.36 ICD-10])   RECOMMENDATIONS - Avoid alcohol, sedatives and other CNS depressants that may worsen sleep apnea and disrupt normal sleep architecture. - Sleep hygiene should be reviewed to assess factors that may improve sleep quality. - Weight management and regular exercise should be initiated or continued if appropriate.   Kara Mead MD Board Certified in [place]

## 2020-03-26 ENCOUNTER — Other Ambulatory Visit (HOSPITAL_COMMUNITY)
Admission: RE | Admit: 2020-03-26 | Discharge: 2020-03-26 | Disposition: A | Discharge status: Home / Self Care | Payer: 59 | Source: Ambulatory Visit | Attending: Emergency Medicine | Admitting: Emergency Medicine

## 2020-03-26 DIAGNOSIS — Z01812 Encounter for preprocedural laboratory examination: Secondary | ICD-10-CM | POA: Diagnosis present

## 2020-03-26 DIAGNOSIS — Z20822 Contact with and (suspected) exposure to covid-19: Secondary | ICD-10-CM | POA: Insufficient documentation

## 2020-03-27 LAB — SARS CORONAVIRUS 2 (TAT 6-24 HRS): SARS Coronavirus 2: NEGATIVE

## 2020-03-29 ENCOUNTER — Encounter: Payer: Self-pay | Admitting: Emergency Medicine

## 2020-03-29 ENCOUNTER — Other Ambulatory Visit: Payer: Self-pay

## 2020-03-29 ENCOUNTER — Ambulatory Visit: Payer: 59 | Admitting: Emergency Medicine

## 2020-03-29 ENCOUNTER — Ambulatory Visit (INDEPENDENT_AMBULATORY_CARE_PROVIDER_SITE_OTHER): Payer: 59 | Admitting: Emergency Medicine

## 2020-03-29 VITALS — BP 114/78 | HR 74 | Temp 98.3°F | Ht 67.0 in | Wt 339.0 lb

## 2020-03-29 DIAGNOSIS — R0602 Shortness of breath: Secondary | ICD-10-CM | POA: Diagnosis not present

## 2020-03-29 DIAGNOSIS — R0683 Snoring: Secondary | ICD-10-CM

## 2020-03-29 DIAGNOSIS — Z8616 Personal history of COVID-19: Secondary | ICD-10-CM | POA: Diagnosis not present

## 2020-03-29 LAB — PULMONARY FUNCTION TEST
DL/VA % pred: 117 %
DL/VA: 4.9 ml/min/mmHg/L
DLCO cor % pred: 105 %
DLCO cor: 24.1 ml/min/mmHg
DLCO unc % pred: 105 %
DLCO unc: 24.1 ml/min/mmHg
FEF 25-75 Post: 2.63 L/sec
FEF 25-75 Pre: 2.22 L/sec
FEF2575-%Change-Post: 18 %
FEF2575-%Pred-Post: 104 %
FEF2575-%Pred-Pre: 88 %
FEV1-%Change-Post: 3 %
FEV1-%Pred-Post: 104 %
FEV1-%Pred-Pre: 100 %
FEV1-Post: 2.63 L
FEV1-Pre: 2.53 L
FEV1FVC-%Change-Post: 5 %
FEV1FVC-%Pred-Pre: 98 %
FEV6-%Change-Post: -1 %
FEV6-%Pred-Post: 102 %
FEV6-%Pred-Pre: 104 %
FEV6-Post: 3.15 L
FEV6-Pre: 3.2 L
FEV6FVC-%Pred-Post: 102 %
FEV6FVC-%Pred-Pre: 102 %
FVC-%Change-Post: -1 %
FVC-%Pred-Post: 99 %
FVC-%Pred-Pre: 101 %
FVC-Post: 3.15 L
FVC-Pre: 3.2 L
Post FEV1/FVC ratio: 84 %
Post FEV6/FVC ratio: 100 %
Pre FEV1/FVC ratio: 79 %
Pre FEV6/FVC Ratio: 100 %
RV % pred: 89 %
RV: 1.8 L
TLC % pred: 89 %
TLC: 4.95 L

## 2020-03-29 NOTE — Assessment & Plan Note (Signed)
Her sleep study was reassuring, no evidence of OSA, desaturation or limb movements

## 2020-03-29 NOTE — Assessment & Plan Note (Signed)
Principally due to deconditioning from her COVID-19 pneumonia at the end of last year, superimposed on her weight.  Her pulmonary function testing and chest x-ray are both reassuring without any evidence for lasting sequela of Covid.  Plan to continue her cardiopulmonary conditioning.  She no longer desaturates we will discontinue her submental oxygen.

## 2020-03-29 NOTE — Progress Notes (Signed)
PFT done today. 

## 2020-03-29 NOTE — Patient Instructions (Signed)
Your pulmonary function testing and chest x-ray are both reassuring.  There is no evidence for lasting injury from the COVID-19 pneumonia. Your sleep study does not show any evidence for obstructive sleep apnea or low oxygen levels. We will discontinue your supplemental oxygen through [place] to work on your conditioning, exercise, weight loss. Follow-up with Dr. Lamonte Sakai if you have any change in your breathing or any new respiratory symptoms.

## 2020-03-29 NOTE — Progress Notes (Signed)
   Subjective:    Patient ID: Jacqueline Morrison, female    DOB: 1966/03/14, 54 y.o.   MRN: [id number]  HPI 54 year old obese woman, never smoker with a history of hypertension, hyperlipidemia, hiatal hernia, arthritis.  She has a history of LAP-BAND surgery 2015.  She is referred for dyspnea, cough, newly identified exertional hypoxemia.  She had COVID-19 pneumonia/pneumonitis in 08/2019.  She has continued to have exertional shortness of breath, fatigue, headaches in the aftermath of this.  Her exercise tolerance is decreased and she has to sit down and take breaks with exertion such as walking or working. Worse over the last 3 months. Difficulty with talking.  She was also started empirically on albuterol 01/16/2020, she reports that this may help her some. She was given O2 to use w exertion, has been using prn until she can get better portable tanks. She has lost 25 lbs since last November. She snores occasionally.   A chest x-ray done 01/19/2020 is reviewed by me, shows no evidence of interstitial change, no infiltrates.  ROV 03/29/20 --follow-up visit for 54 year old obese woman with hypertension, hyperlipidemia, hiatal hernia.  Here to further evaluate shortness of breath. She tells me that she has been better, has not needed O2. She can still have DOE with walking - able to walk a longer distance. She has used some albuterol for chest tightness, less frequently. Last used it beginning of June.   Pulmonary function testing done today reviewed by me, shows overall normal airflows without a bronchodilator response, normal lung volumes and a normal diffusion capacity  Sleep study done 02/29/2020, reviewed by me, shows 0.2/h, soft snoring, no periodic limb movements no hypoxemia  MDM: Sleep study, PFT Office visit 02/09/2020 in COVID-19 clinic   Review of Systems As per HPI     Objective:   Physical Exam Today's Vitals   03/29/20 1402  BP: 114/78  Pulse: 74  Temp: 98.3 F (36.8 C)  TempSrc:  Oral  SpO2: 98%  Weight: (!) 339 lb (153.8 kg)  Height: 5\' 7"  (1.702 m)   Body mass index is 53.09 kg/m.  Gen: Pleasant, obese woman, in no distress,  normal affect  ENT: No lesions,  mouth clear,  oropharynx clear, no postnasal drip  Neck: No JVD, no stridor  Lungs: No use of accessory muscles, no crackles or wheezing on normal respiration, no wheeze on forced expiration  Cardiovascular: RRR, heart sounds normal, no murmur or gallops, no peripheral edema  Musculoskeletal: No deformities, no cyanosis or clubbing  Neuro: alert, awake, non focal  Skin: Warm, no lesions or rash     Assessment & Plan:  Shortness of breath Principally due to deconditioning from her COVID-19 pneumonia at the end of last year, superimposed on her weight.  Her pulmonary function testing and chest x-ray are both reassuring without any evidence for lasting sequela of Covid.  Plan to continue her cardiopulmonary conditioning.  She no longer desaturates we will discontinue her submental oxygen.  Snoring Her sleep study was reassuring, no evidence of OSA, desaturation or limb movements  Baltazar Apo, MD, PhD 03/29/2020, 2:17 PM [facility] [phone] or if no answer [phone]

## 2020-06-04 IMAGING — DX DG CHEST 2V
2 series · 2 of 2 positions shown · non-contrast
Comparison: 09/17/2019

CLINICAL DATA: History of COVID this past [place] with persistent
shortness of breath.

EXAM:
CHEST - 2 VIEW

[chest pa]
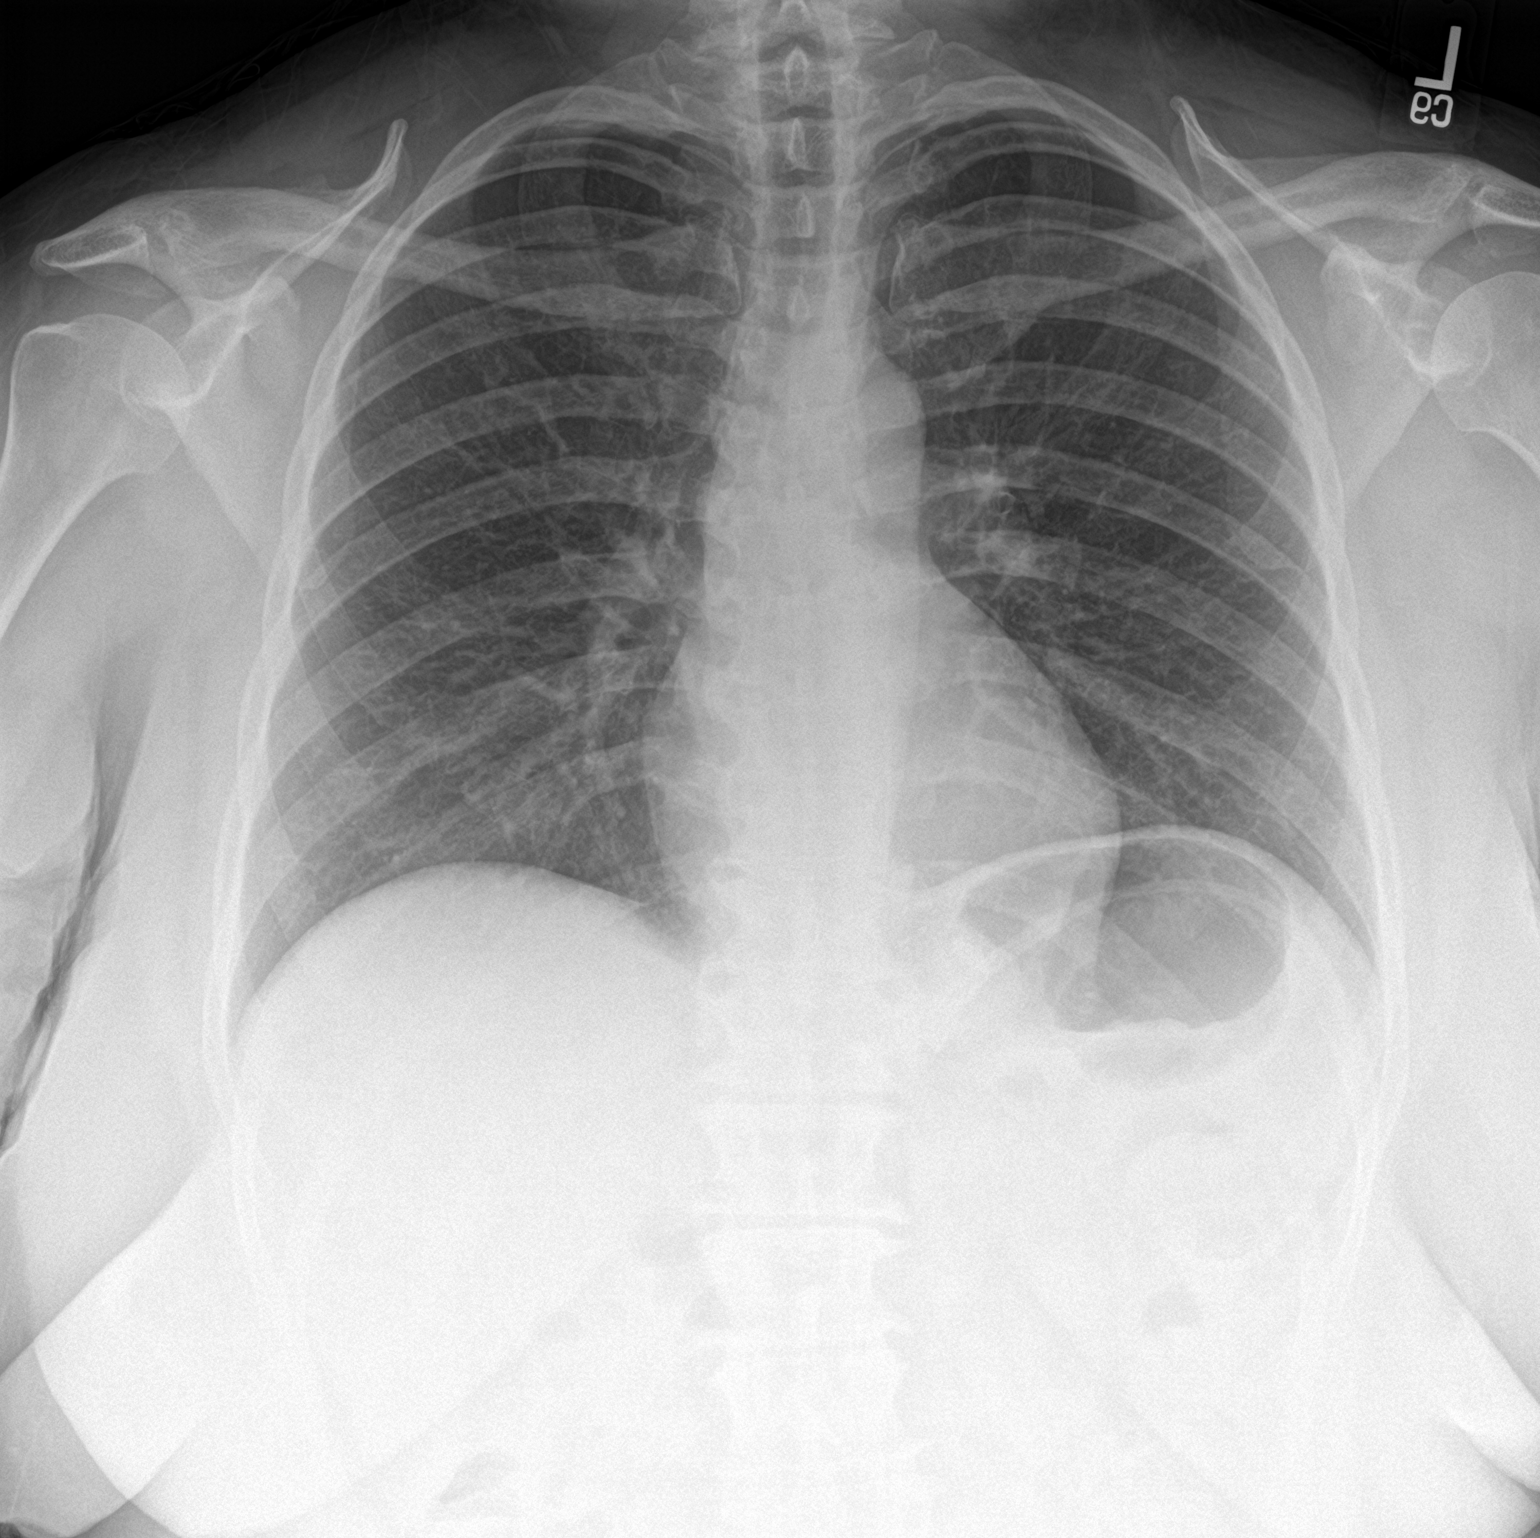

[chest lat]
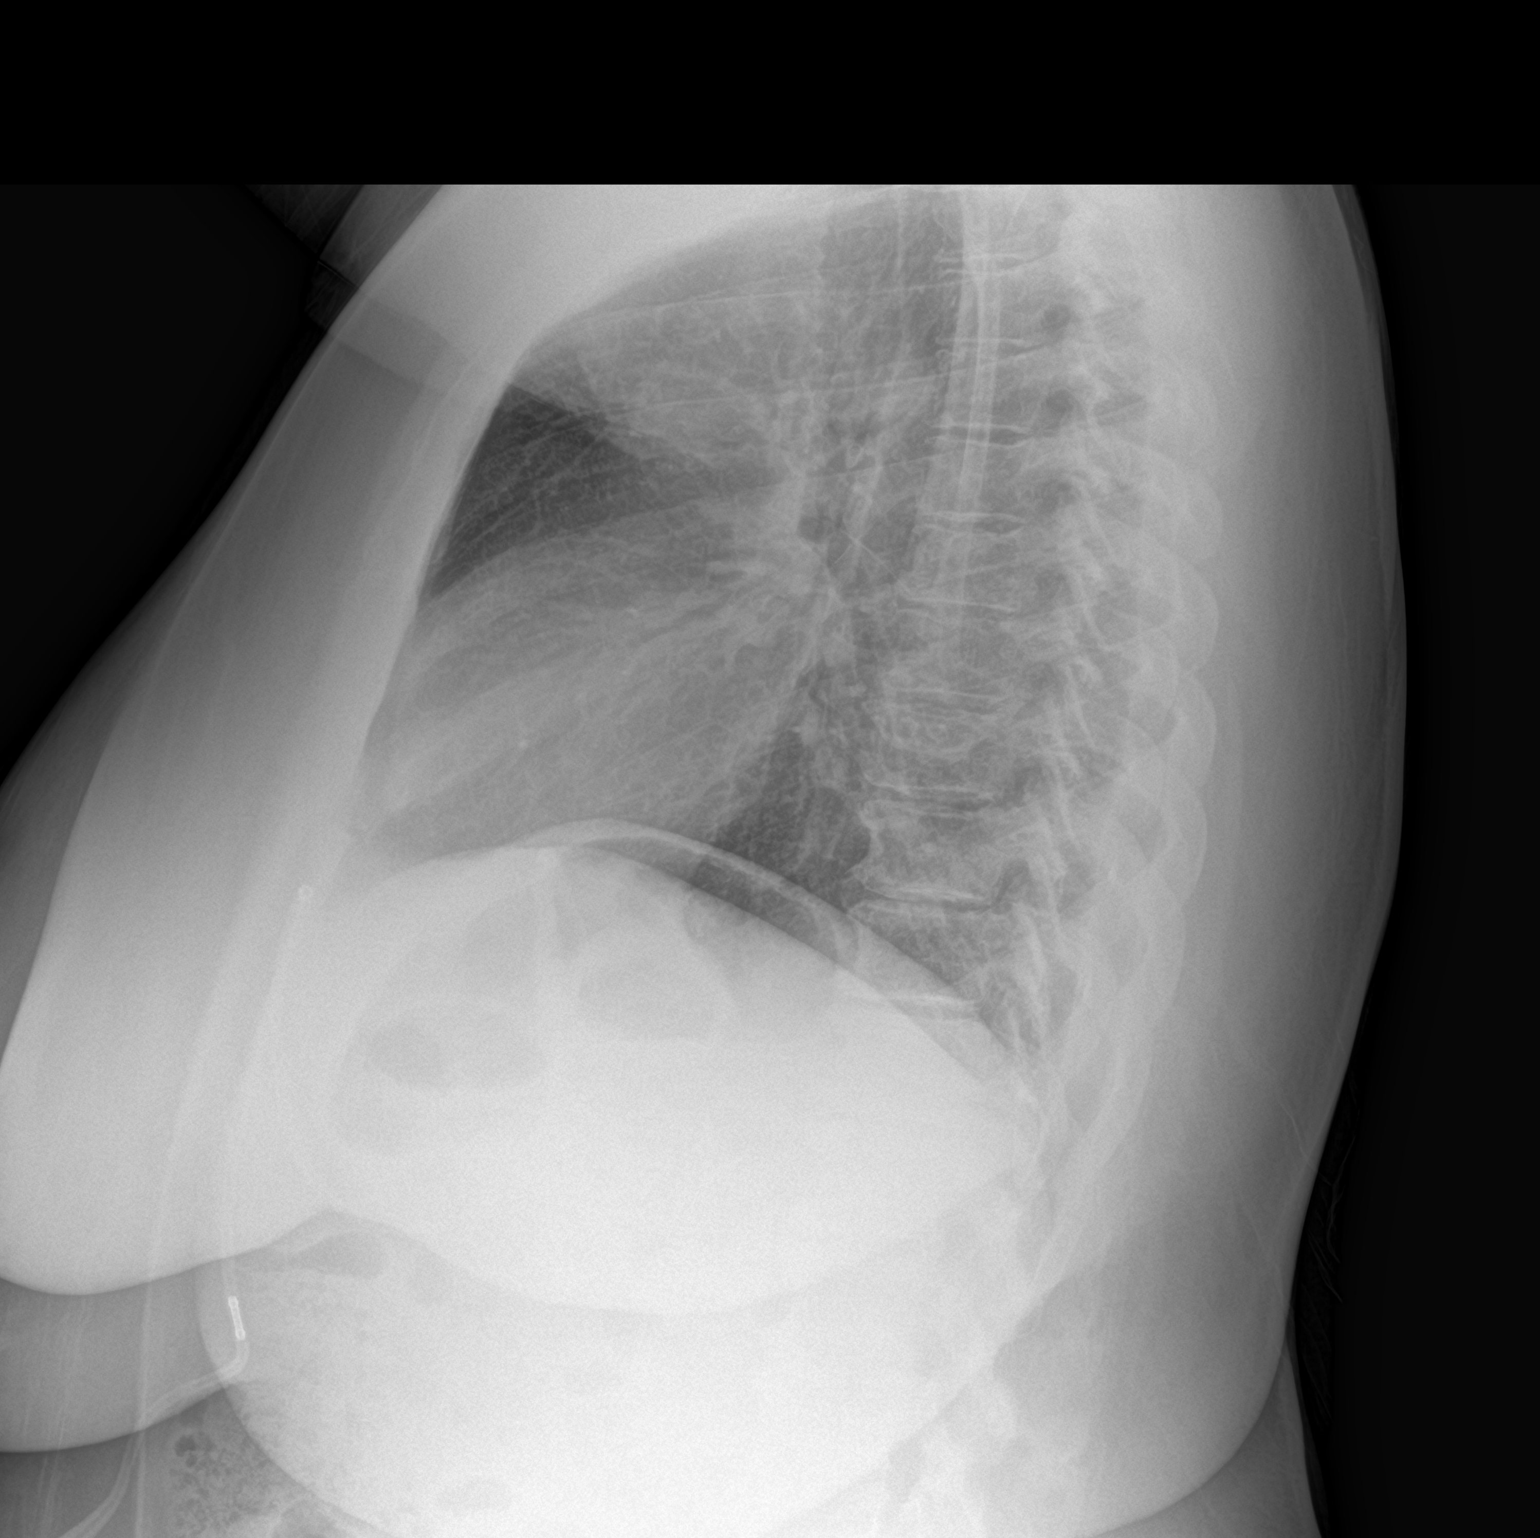

[2 of 2 positions shown; findings below may reference images not displayed]

FINDINGS: Grossly unchanged cardiac silhouette and mediastinal contours. No
focal parenchymal opacities. No pleural effusion or pneumothorax. No
evidence of edema.

Gastric lap banding device overlies the upper abdomen, incompletely
evaluated.

No acute osseous abnormalities. Stigmata of dish within the lower
thoracic spine.
IMPRESSION: No acute cardiopulmonary disease specifically, no radiographic
evidence of COVID related fibrosis/scarring.

## 2020-06-29 ENCOUNTER — Ambulatory Visit (INDEPENDENT_AMBULATORY_CARE_PROVIDER_SITE_OTHER): Payer: 59 | Admitting: Nurse Practitioner

## 2020-06-29 ENCOUNTER — Other Ambulatory Visit: Payer: Self-pay

## 2020-06-29 VITALS — BP 128/74 | HR 70 | Temp 97.1°F | Ht 67.0 in | Wt 334.0 lb

## 2020-06-29 DIAGNOSIS — Z8616 Personal history of COVID-19: Secondary | ICD-10-CM

## 2020-06-29 NOTE — Progress Notes (Signed)
@Patient  ID: Jacqueline Morrison, female    DOB: 1966/02/25, 54 y.o.   MRN: [id number]  Chief Complaint  Patient presents with   Follow-up    Has been sick twice since July, finishing steriods rx by [facility].    Referring provider: Drosinis, Pamalee Leyden, PA-C   54 year old female with history of hypertension, hyperlipidemia, and obesity. Diagnosed with Covid 11/21.  HPI  Patient presents today for post COVID care clinic visit.  She has followed up with sleep medicine and pulmonary since her last visit here.  She no longer needs oxygen at this point.  Her pulmonary function test and chest x-ray were both reassuring without any evidence of lasting sequelae of Covid.  Shortness of breath at this point is most likely due to gait deconditioning from Covid pneumonia superimposed on her weight.  She did complete her sleep study which was reassuring showed no evidence of sleep apnea, desaturation, or limb movements per pulmonary note.  Patient states that in the past few weeks she has been sick twice with URI symptoms.  She was treated by her primary care physician.  She is currently on a prednisone taper.  She states that she is improving. Denies f/c/s, n/v/d, hemoptysis, PND, chest pain or edema.       Allergies  Allergen Reactions   Benzoin Other (See Comments) and Swelling    unknown    Gadobenate Nausea Only    MRI Contrast     Immunization History  Administered Date(s) Administered   Influenza-Unspecified 07/31/2016    Past Medical History:  Diagnosis Date   Arthritis    KNEES, HIPS, ELBOWS, HANDS   Complication of anesthesia    PT REMEMBERS WAKING UP WHILE INTUBATED WITH  UTERINE ABLATION IN 2006--STATES SHE WAS QUICKLY PUT BACK TO SLEEP--THAT WAS PT'S  LAST SURGERY--SURGERY WAS AT [facility] IN [place].   H/O hiatal hernia    SEEN ON XRAY-NO SYMPTOMS   Hyperlipidemia    Hypertension     Tobacco History: Social History   Tobacco  Use  Smoking Status Never Smoker  Smokeless Tobacco Never Used   Counseling given: Not Answered   Outpatient Encounter Medications as of 06/29/2020  Medication Sig   acetaminophen (TYLENOL) 500 MG tablet Take 500 mg by mouth every 6 (six) hours as needed. pain   albuterol (PROVENTIL) (2.5 MG/3ML) 0.083% nebulizer solution Take 3 mLs (2.5 mg total) by nebulization every 6 (six) hours as needed for wheezing or shortness of breath.   albuterol (VENTOLIN HFA) 108 (90 Base) MCG/ACT inhaler Inhale 2 puffs into the lungs every 6 (six) hours as needed for wheezing or shortness of breath.   calcium carbonate, dosed in mg elemental calcium, 1250 MG/5ML Take 500 mg of elemental calcium by mouth.   chlorthalidone (HYGROTON) 25 MG tablet TAKE 1 TABLET BY MOUTH EVERY MORNING   methocarbamol (ROBAXIN) 500 MG tablet Take by mouth.   metoprolol succinate (TOPROL-XL) 25 MG 24 hr tablet Take 25 mg by mouth daily.   Multiple Vitamin (MULTIVITAMIN) capsule Take 1 capsule by mouth daily.    Potassium Chloride ER 20 MEQ TBCR Take 1 tablet by mouth daily.   rosuvastatin (CRESTOR) 20 MG tablet TAKE 1 TABLET(20 MG) BY MOUTH EVERY EVENING   topiramate (TOPAMAX) 25 MG tablet    No facility-administered encounter medications on file as of 06/29/2020.     Review of Systems  Review of Systems  Constitutional: Negative.   HENT: Negative.  Respiratory: Negative for cough and shortness of breath.   Cardiovascular: Negative.   Gastrointestinal: Negative.   Allergic/Immunologic: Negative.   Neurological: Negative.   Psychiatric/Behavioral: Negative.        Physical Exam  BP 128/74 (BP Location: Left Arm)    Pulse 70    Temp (!) 97.1 F (36.2 C)    Ht 5\' 7"  (1.702 m)    Wt (!) 334 lb 0.1 oz (151.5 kg)    SpO2 98%    BMI 52.31 kg/m   Wt Readings from Last 5 Encounters:  06/29/20 (!) 334 lb 0.1 oz (151.5 kg)  03/29/20 (!) 339 lb (153.8 kg)  02/29/20 (!) 336 lb (152.4 kg)  02/05/20 (!) 334 lb 6.4  oz (151.7 kg)  01/16/20 (!) 344 lb 12 oz (156.4 kg)     Physical Exam Vitals and nursing note reviewed.  Constitutional:      General: She is not in acute distress.    Appearance: She is well-developed.  Cardiovascular:     Rate and Rhythm: Normal rate and regular rhythm.  Pulmonary:     Effort: Pulmonary effort is normal.     Breath sounds: Normal breath sounds.  Musculoskeletal:     Right lower leg: No edema.     Left lower leg: No edema.  Neurological:     Mental Status: She is alert and oriented to person, place, and time.  Psychiatric:        Mood and Affect: Mood normal.        Behavior: Behavior normal.       Assessment & Plan:   History of COVID-19 Cough:   Stay well hydrated  Stay active  Deep breathing exercises  May start vitamin C 2,000 mg daily, vitamin D3 2,000 IU daily, Zinc 220 mg daily, and Quercetin 500 mg twice daily  May take mucinex DM twice daily  Follow up with pulmonary as needed    Follow up:  Follow up as needed      Fenton Foy, NP 07/01/2020

## 2020-06-29 NOTE — Patient Instructions (Addendum)
History of Covid 19 Cough:   Stay well hydrated  Stay active  Deep breathing exercises  May start vitamin C 2,000 mg daily, vitamin D3 2,000 IU daily, Zinc 220 mg daily, and Quercetin 500 mg twice daily  May take mucinex DM twice daily  Follow up with pulmonary as needed    Follow up:  Follow up as needed

## 2020-07-01 NOTE — Assessment & Plan Note (Signed)
Cough:   Stay well hydrated  Stay active  Deep breathing exercises  May start vitamin C 2,000 mg daily, vitamin D3 2,000 IU daily, Zinc 220 mg daily, and Quercetin 500 mg twice daily  May take mucinex DM twice daily  Follow up with pulmonary as needed    Follow up:  Follow up as needed

## 2020-12-08 ENCOUNTER — Emergency Department (HOSPITAL_BASED_OUTPATIENT_CLINIC_OR_DEPARTMENT_OTHER)
Admission: EM | Admit: 2020-12-08 | Discharge: 2020-12-08 | Disposition: A | Discharge status: Home / Self Care | Payer: 59 | Attending: Emergency Medicine | Admitting: Emergency Medicine

## 2020-12-08 ENCOUNTER — Other Ambulatory Visit: Payer: Self-pay

## 2020-12-08 ENCOUNTER — Encounter (HOSPITAL_BASED_OUTPATIENT_CLINIC_OR_DEPARTMENT_OTHER): Payer: Self-pay

## 2020-12-08 ENCOUNTER — Emergency Department (HOSPITAL_BASED_OUTPATIENT_CLINIC_OR_DEPARTMENT_OTHER): Payer: 59

## 2020-12-08 DIAGNOSIS — I10 Essential (primary) hypertension: Secondary | ICD-10-CM | POA: Diagnosis not present

## 2020-12-08 DIAGNOSIS — Z8616 Personal history of COVID-19: Secondary | ICD-10-CM | POA: Diagnosis not present

## 2020-12-08 DIAGNOSIS — M25552 Pain in left hip: Secondary | ICD-10-CM | POA: Insufficient documentation

## 2020-12-08 MED ORDER — OXYCODONE-ACETAMINOPHEN 5-325 MG PO TABS
1.0000 | ORAL_TABLET | ORAL | Status: DC | PRN
  Administered 2020-12-08: 1 via ORAL
  Filled 2020-12-08: qty 1

## 2020-12-08 MED ORDER — METHYLPREDNISOLONE 4 MG PO TBPK: ORAL_TABLET | ORAL | 0 refills | Status: AC

## 2020-12-08 MED ORDER — HYDROCODONE-ACETAMINOPHEN 5-325 MG PO TABS
1.0000 | ORAL_TABLET | ORAL | 0 refills | Status: AC | PRN
Start: 2020-12-08 — End: 2020-12-11

## 2020-12-08 MED ORDER — HYDROCODONE-ACETAMINOPHEN 5-325 MG PO TABS: 1.0000 | ORAL_TABLET | ORAL | 0 refills | Status: DC | PRN

## 2020-12-08 NOTE — ED Triage Notes (Signed)
Pt c/o left hip pain since Monday, worse today, states cant walk. Denies known injury. States had similar pain in past but not to this degree. Took tylenol without relief.

## 2020-12-08 NOTE — Discharge Instructions (Addendum)
Please take the pain medicine as needed for pain.  Do not take additional Tylenol, you may take ibuprofen in combination.  You may also apply heating pad, gentle stretching.  Use the Lidoderm patches as well.  Please follow-up with your primary care doctor.  Return to the ER for any new or worsening symptoms

## 2020-12-08 NOTE — ED Provider Notes (Signed)
[facility] EMERGENCY DEPARTMENT Provider Note   CSN: [id number] Arrival date & time: 12/08/20  1732     History Chief Complaint  Patient presents with  . Hip Pain    Jacqueline Morrison is a 55 y.o. female.  HPI 55 year old female with a history of arthritis, hyperlipidemia, hypertension, obesity presents to the ER with complaints of left hip pain which has been ongoing since Monday.  Patient states that she was walking up the stairs at work and felt a sharp absent of pain.  Pain feels to be localized in the hip, and radiating into the groin.  She denies any loss of bowel bladder control, no foot drop, no numbness or tingling down her legs.  She states that she has had something similar to this several years ago, was treated by her PCP with steroids.  She states that she did have some imaging of her hip done many years ago, but does not remember exactly what her diagnosis was.  She denies any fevers or chills, no history of IV drug use.  She has taken some Tylenol with little relief.    Past Medical History:  Diagnosis Date  . Arthritis    KNEES, HIPS, ELBOWS, HANDS  . Complication of anesthesia    PT REMEMBERS WAKING UP WHILE INTUBATED WITH  UTERINE ABLATION IN 2006--STATES SHE WAS QUICKLY PUT BACK TO SLEEP--THAT WAS PT'S  LAST SURGERY--SURGERY WAS AT [facility] IN [place].  Marland Kitchen H/O hiatal hernia    SEEN ON XRAY-NO SYMPTOMS  . Hyperlipidemia   . Hypertension     Patient Active Problem List   Diagnosis Date Noted  . Snoring 03/29/2020  . History of COVID-19 01/16/2020  . Shortness of breath 01/16/2020  . 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 Obesity-BMI 58 09/29/2011    Past Surgical History:  Procedure Laterality Date  . APPENDECTOMY    . DILATION AND CURETTAGE OF UTERUS  1998  . ENDOMETRIAL ABLATION  2006  . HIATAL HERNIA REPAIR  04/16/2012   Procedure: LAPAROSCOPIC REPAIR OF HIATAL HERNIA;  Surgeon: Pedro Earls, MD;  Location: [facility]  ORS;  Service: General;;  . HYSTEROSCOPY  1998  . LAPAROSCOPIC GASTRIC BANDING  04/16/2012   Procedure: LAPAROSCOPIC GASTRIC BANDING;  Surgeon: Pedro Earls, MD;  Location: [facility] ORS;  Service: General;  Laterality: N/A;  . TUBAL LIGATION       OB History   No obstetric history on file.     Family History  Problem Relation Age of Onset  . Cancer Maternal Aunt        breast  . Cancer Maternal Grandfather        colon    Social History   Tobacco Use  . Smoking status: Never Smoker  . Smokeless tobacco: Never Used  Vaping Use  . Vaping Use: Never used  Substance Use Topics  . Alcohol use: Yes    Alcohol/week: 2.0 standard drinks    Types: 2 Glasses of wine per week  . Drug use: No    Home Medications Prior to Admission medications   Medication Sig Start Date End Date Taking? Authorizing Provider  HYDROcodone-acetaminophen (NORCO/VICODIN) 5-325 MG tablet Take 1 tablet by mouth every 4 (four) hours as needed for up to 3 days. 12/08/20 12/11/20 Yes Garald Balding, PA-C  acetaminophen (TYLENOL) 500 MG tablet Take 500 mg by mouth every 6 (six) hours as needed. pain    [provider]  albuterol (PROVENTIL) (2.5 MG/3ML) 0.083% nebulizer solution Take 3 mLs (2.5 mg total) by nebulization every 6 (six) hours as needed for wheezing or shortness of breath. 01/16/20   Fenton Foy, NP  albuterol (VENTOLIN HFA) 108 (90 Base) MCG/ACT inhaler Inhale 2 puffs into the lungs every 6 (six) hours as needed for wheezing or shortness of breath. 01/16/20   Fenton Foy, NP  calcium carbonate, dosed in mg elemental calcium, 1250 MG/5ML Take 500 mg of elemental calcium by mouth.    [provider]  chlorthalidone (HYGROTON) 25 MG tablet TAKE 1 TABLET BY MOUTH EVERY MORNING 05/16/17   [provider]  methocarbamol (ROBAXIN) 500 MG tablet Take by mouth. 12/02/19   [provider]  metoprolol succinate (TOPROL-XL) 25 MG 24 hr tablet Take 25 mg by mouth daily. 01/02/20    [provider]  Multiple Vitamin (MULTIVITAMIN) capsule Take 1 capsule by mouth daily.     [provider]  Potassium Chloride ER 20 MEQ TBCR Take 1 tablet by mouth daily. 12/21/19   [provider]  rosuvastatin (CRESTOR) 20 MG tablet TAKE 1 TABLET(20 MG) BY MOUTH EVERY EVENING 05/16/17   [provider]  topiramate (TOPAMAX) 25 MG tablet  01/02/20   [provider]    Allergies    Benzoin and Gadobenate  Review of Systems   Review of Systems  Constitutional: Negative for chills and fever.  Musculoskeletal: Positive for arthralgias.  Neurological: Negative for weakness and numbness.    Physical Exam Updated Vital Signs BP (!) 149/99 (BP Location: Left Arm)   Pulse (!) 56   Temp (!) 97.4 F (36.3 C) (Tympanic)   Resp 18   Ht 5\' 7"  (1.702 m)   Wt (!) 153.3 kg   SpO2 100%   BMI 52.94 kg/m   Physical Exam Vitals reviewed.  Constitutional:      Appearance: Normal appearance. She is obese.     Comments: Appears uncomfortable  HENT:     Head: Normocephalic and atraumatic.  Eyes:     General:        Right eye: No discharge.        Left eye: No discharge.     Extraocular Movements: Extraocular movements intact.     Conjunctiva/sclera: Conjunctivae normal.  Musculoskeletal:        General: Tenderness present. No swelling, deformity or signs of injury. Normal range of motion.     Right lower leg: No edema.     Left lower leg: No edema.     Comments: Decreased range of motion of the left hip joint secondary to pain, however full passive range of motion on exam.  No overlying erythema, swelling, warmth.  She has 2+ DP pulses, 5/5 strength.  Full flexion extension of the knee.  She has no midline tenderness to the C, T, L-spine.  She does have some tenderness to the SI joint and associated muscle tenderness in the hip.  Patient is able to bear weight, though ambulating gingerly  Skin:    General: Skin is warm and dry.     Capillary  Refill: Capillary refill takes less than 2 seconds.     Findings: No erythema.  Neurological:     General: No focal deficit present.     Mental Status: She is alert and oriented to person, place, and time.     Sensory: No sensory deficit.     Motor: No weakness.  Psychiatric:  Mood and Affect: Mood normal.        Behavior: Behavior normal.     ED Results / Procedures / Treatments   Labs (all labs ordered are listed, but only abnormal results are displayed) Labs Reviewed - No data to display  EKG None  Radiology DG Hip Unilat With Pelvis 2-3 Views Left  Result Date: 12/08/2020 CLINICAL DATA:  Left hip pain for 2 days EXAM: DG HIP (WITH OR WITHOUT PELVIS) 2-3V LEFT COMPARISON:  12/24/2018 FINDINGS: Frontal view of the pelvis as well as frontal and frogleg lateral views of the left hip are obtained. No acute fracture, subluxation, or dislocation. Joint spaces are well preserved. Sacroiliac joints are normal. IMPRESSION: 1. Unremarkable pelvis and left hip. Electronically Signed   By: Randa Ngo M.D.   On: 12/08/2020 18:45    Procedures Procedures   Medications Ordered in ED Medications  oxyCODONE-acetaminophen (PERCOCET/ROXICET) 5-325 MG per tablet 1 tablet (1 tablet Oral Given 12/08/20 1832)    ED Course  I have reviewed the triage vital signs and the nursing notes.  Pertinent labs & imaging results that were available during my care of the patient were reviewed by me and considered in my medical decision making (see chart for details).    MDM Rules/Calculators/A&P                          55 year old female with left hip pain since Monday.  Patient is afebrile on arrival, other vitals reassuring.  Physical exam with limited range of motion of the hip secondary to pain, however she does have full passive flexion and extension of the hip.  She has pain to the SI joint, as well as associated surrounding muscle tenderness on exam.  She able to bear weight and able to  ambulate, though with caution.  She has no evidence of neuro deficits.  No red flag signs including fevers, chills, IV drug use, unintended weight loss, loss of bowel bladder control, footdrop, numbness or tingling.  Patient did appear uncomfortable on exam, I did review her x-ray which was largely unremarkable.  We did discuss option of further CT scan, however patient declined this stating that she has had a history of this before.  We discussed for the plan of care, will send home with Medrol Dosepak, short course of opioids.  I did offer Lidoderm patches however the patient states that she has these at home.  PDMP reviewed appropriate.  Patient did call her PCP today and was not able to get an appointment until 3/16, but will attempt to move up her appointment earlier.  We discussed return precautions.  Low suspicion for cauda equina, septic joint, dislocation, acute fracture at this time.  Patient voiced understanding, stable for discharge   Final Clinical Impression(s) / ED Diagnoses Final diagnoses:  Left hip pain    Rx / DC Orders ED Discharge Orders         Ordered    HYDROcodone-acetaminophen (NORCO/VICODIN) 5-325 MG tablet  Every 4 hours PRN        12/08/20 1929           Lyndel Safe 12/08/20 1930    Margette Fast, MD 12/16/20 1011

## 2021-04-24 IMAGING — DX DG HIP (WITH OR WITHOUT PELVIS) 2-3V*L*
3 series · 3 of 3 positions shown · non-contrast
Comparison: 12/24/2018

CLINICAL DATA: Left hip pain for 2 days

EXAM:
DG HIP (WITH OR WITHOUT PELVIS) 2-3V LEFT

[pelvis ap]
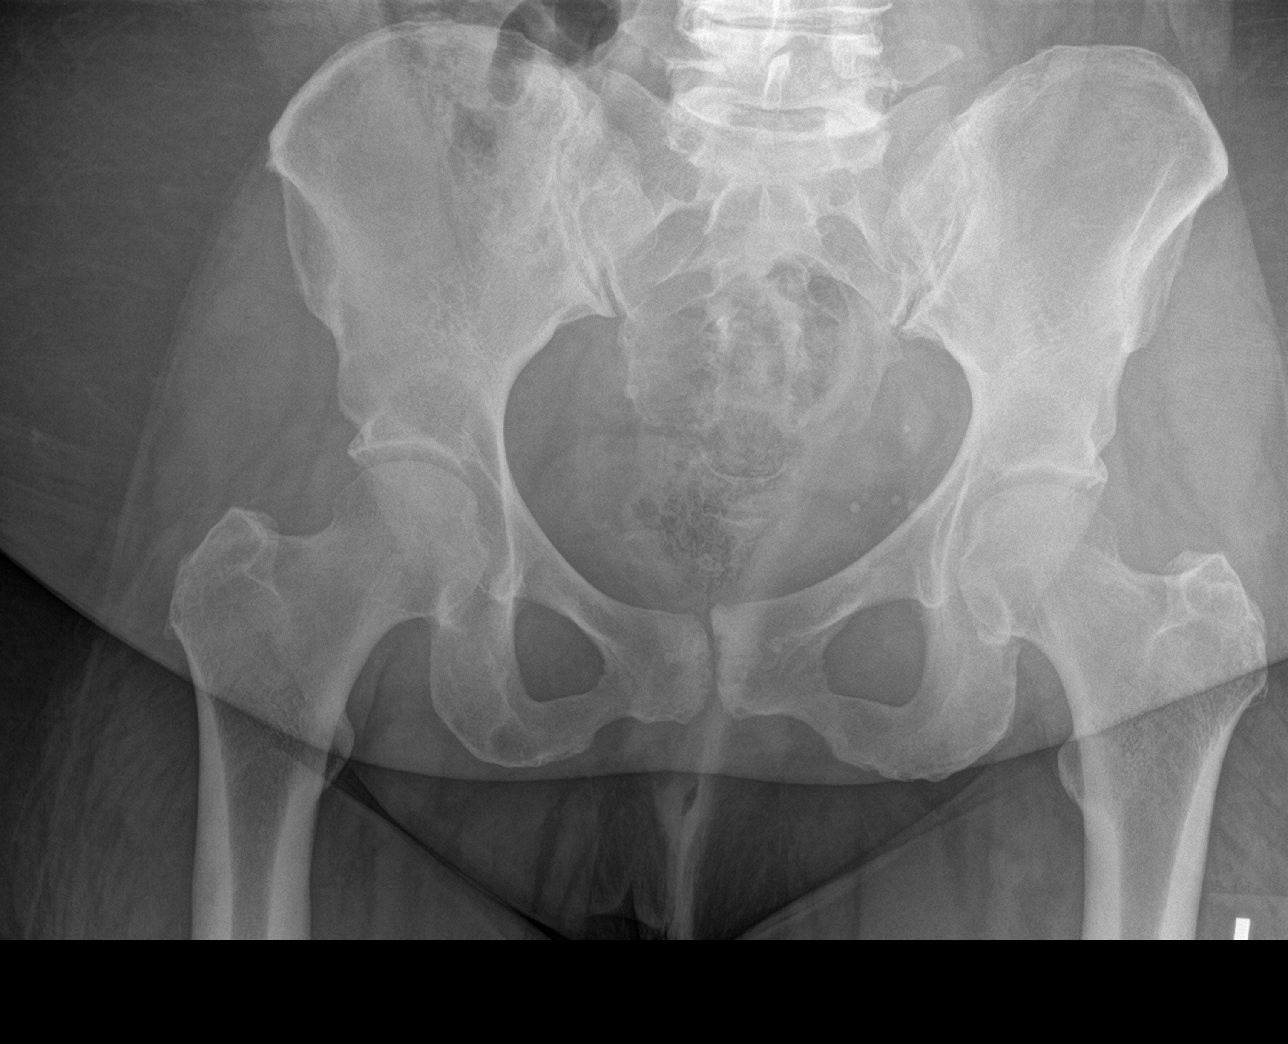

[hip ap]
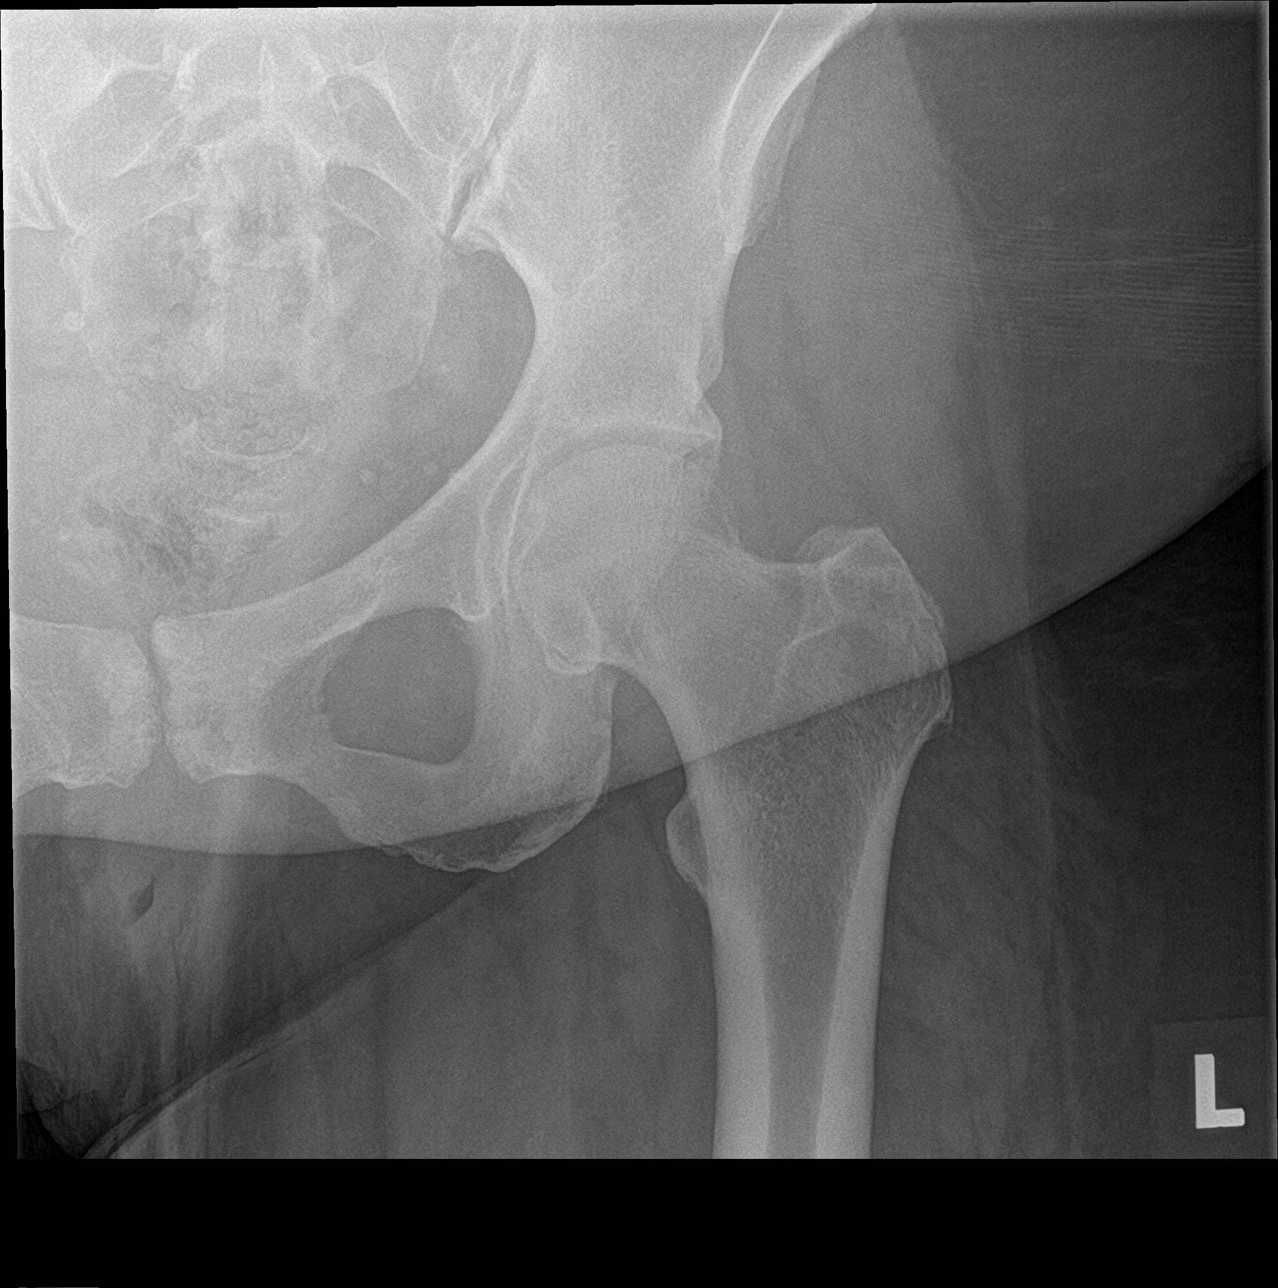

[hip lat]
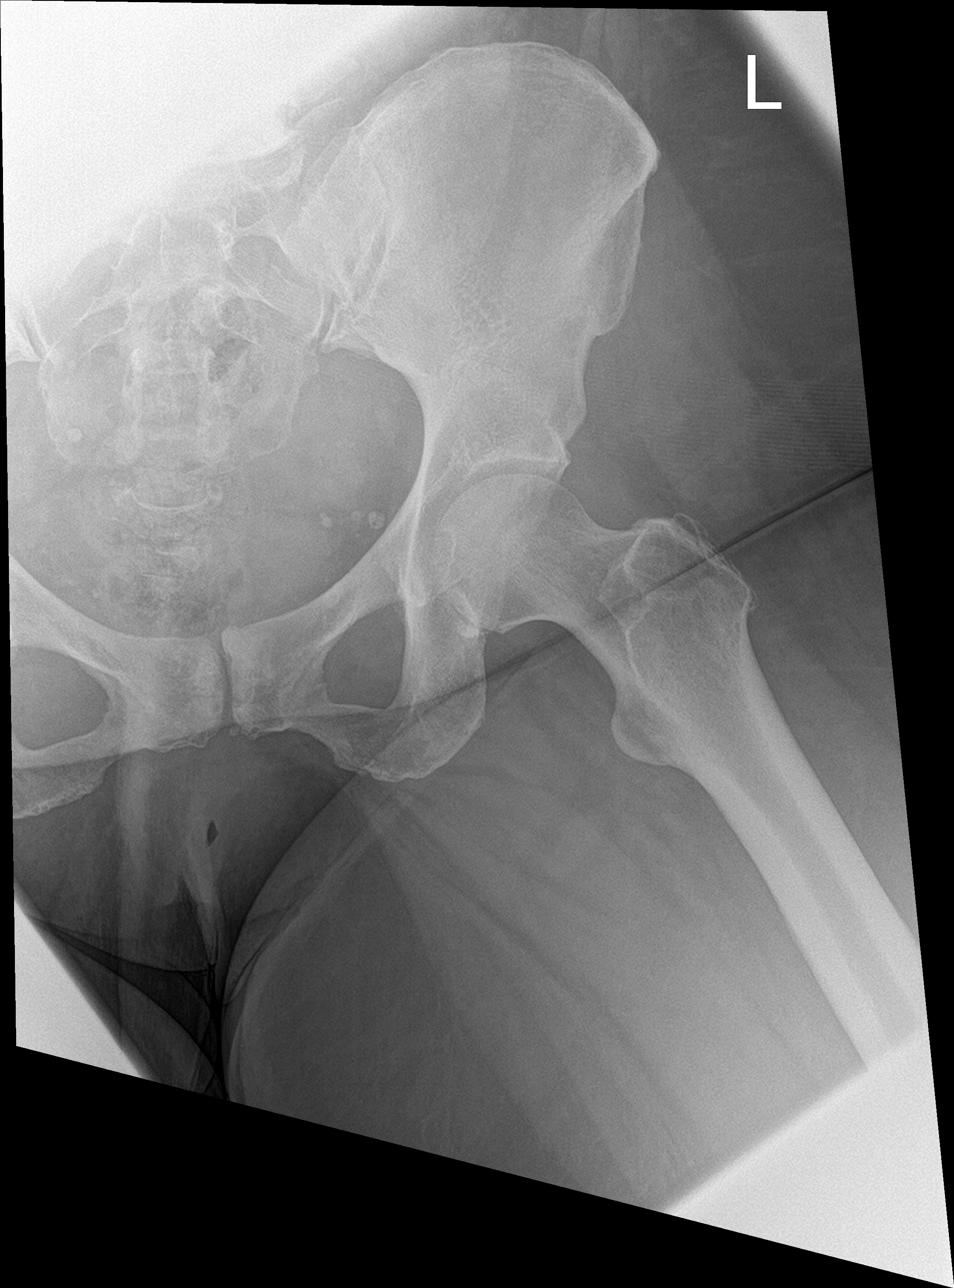

[3 of 3 positions shown; findings below may reference images not displayed]

FINDINGS: Frontal view of the pelvis as well as frontal and frogleg lateral
views of the left hip are obtained. No acute fracture, subluxation,
or dislocation. Joint spaces are well preserved. Sacroiliac joints
are normal.
IMPRESSION: 1. Unremarkable pelvis and left hip.
